# Patient Record
Sex: Male | Born: 1970 | Race: White | Hispanic: No | Marital: Married | Smoking: Never smoker
Health system: Southern US, Community
[De-identification: ages and names within clinical notes are randomized; demographics above are authoritative.]

## PROBLEM LIST (undated history)

## (undated) DIAGNOSIS — K219 Gastro-esophageal reflux disease without esophagitis: Secondary | ICD-10-CM

## (undated) DIAGNOSIS — K589 Irritable bowel syndrome without diarrhea: Secondary | ICD-10-CM

## (undated) HISTORY — PX: KNEE SURGERY: SHX244

---

## 2011-09-29 ENCOUNTER — Emergency Department (HOSPITAL_BASED_OUTPATIENT_CLINIC_OR_DEPARTMENT_OTHER)
Admission: EM | Admit: 2011-09-29 | Discharge: 2011-09-29 | Disposition: A | Discharge status: Home / Self Care | Payer: Self-pay

## 2013-01-25 ENCOUNTER — Emergency Department (HOSPITAL_BASED_OUTPATIENT_CLINIC_OR_DEPARTMENT_OTHER)
Admission: EM | Admit: 2013-01-25 | Discharge: 2013-01-25 | Disposition: A | Discharge status: Home / Self Care | Attending: Emergency Medicine | Admitting: Emergency Medicine

## 2013-01-25 ENCOUNTER — Encounter (HOSPITAL_BASED_OUTPATIENT_CLINIC_OR_DEPARTMENT_OTHER): Payer: Self-pay | Admitting: *Deleted

## 2013-01-25 DIAGNOSIS — T7840XA Allergy, unspecified, initial encounter: Secondary | ICD-10-CM

## 2013-01-25 DIAGNOSIS — R42 Dizziness and giddiness: Secondary | ICD-10-CM | POA: Insufficient documentation

## 2013-01-25 DIAGNOSIS — Y92009 Unspecified place in unspecified non-institutional (private) residence as the place of occurrence of the external cause: Secondary | ICD-10-CM | POA: Insufficient documentation

## 2013-01-25 DIAGNOSIS — T6391XA Toxic effect of contact with unspecified venomous animal, accidental (unintentional), initial encounter: Secondary | ICD-10-CM | POA: Insufficient documentation

## 2013-01-25 DIAGNOSIS — R51 Headache: Secondary | ICD-10-CM | POA: Insufficient documentation

## 2013-01-25 DIAGNOSIS — Y939 Activity, unspecified: Secondary | ICD-10-CM | POA: Insufficient documentation

## 2013-01-25 DIAGNOSIS — IMO0001 Reserved for inherently not codable concepts without codable children: Secondary | ICD-10-CM | POA: Insufficient documentation

## 2013-01-25 MED ORDER — FAMOTIDINE IN NACL 20-0.9 MG/50ML-% IV SOLN
20.0000 mg | Freq: Once | INTRAVENOUS | Status: AC
  Administered 2013-01-25: 20 mg via INTRAVENOUS
  Filled 2013-01-25: qty 50

## 2013-01-25 MED ORDER — DIPHENHYDRAMINE HCL 25 MG PO TABS: 25.0000 mg | ORAL_TABLET | Freq: Four times a day (QID) | ORAL | Status: DC

## 2013-01-25 MED ORDER — METHYLPREDNISOLONE SODIUM SUCC 125 MG IJ SOLR
125.0000 mg | Freq: Once | INTRAMUSCULAR | Status: AC
  Administered 2013-01-25: 125 mg via INTRAVENOUS
  Filled 2013-01-25: qty 2

## 2013-01-25 MED ORDER — PREDNISONE 50 MG PO TABS: 50.0000 mg | ORAL_TABLET | Freq: Every day | ORAL | Status: DC

## 2013-01-25 MED ORDER — DIPHENHYDRAMINE HCL 50 MG/ML IJ SOLN
25.0000 mg | Freq: Once | INTRAMUSCULAR | Status: AC
  Administered 2013-01-25: 25 mg via INTRAVENOUS
  Filled 2013-01-25: qty 1

## 2013-01-25 MED ORDER — SODIUM CHLORIDE 0.9 % IV BOLUS (SEPSIS)
1000.0000 mL | Freq: Once | INTRAVENOUS | Status: AC
  Administered 2013-01-25: 1000 mL via INTRAVENOUS
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
## 2013-01-25 NOTE — ED Notes (Signed)
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 Last time hospitalized x 2 days

## 2013-01-25 NOTE — ED Notes (Signed)
D/c home with ride- rx x 2 given for benadryl and prednisone- note for work given

## 2014-07-17 ENCOUNTER — Emergency Department (HOSPITAL_BASED_OUTPATIENT_CLINIC_OR_DEPARTMENT_OTHER)
Admission: EM | Admit: 2014-07-17 | Discharge: 2014-07-17 | Disposition: A | Discharge status: Home / Self Care | Payer: 59 | Attending: Emergency Medicine | Admitting: Emergency Medicine

## 2014-07-17 ENCOUNTER — Emergency Department (HOSPITAL_BASED_OUTPATIENT_CLINIC_OR_DEPARTMENT_OTHER): Payer: 59

## 2014-07-17 ENCOUNTER — Encounter (HOSPITAL_BASED_OUTPATIENT_CLINIC_OR_DEPARTMENT_OTHER): Payer: Self-pay | Admitting: Emergency Medicine

## 2014-07-17 DIAGNOSIS — Z8719 Personal history of other diseases of the digestive system: Secondary | ICD-10-CM | POA: Diagnosis not present

## 2014-07-17 DIAGNOSIS — S8991XA Unspecified injury of right lower leg, initial encounter: Secondary | ICD-10-CM | POA: Diagnosis present

## 2014-07-17 DIAGNOSIS — Z7952 Long term (current) use of systemic steroids: Secondary | ICD-10-CM | POA: Insufficient documentation

## 2014-07-17 DIAGNOSIS — S86911A Strain of unspecified muscle(s) and tendon(s) at lower leg level, right leg, initial encounter: Secondary | ICD-10-CM | POA: Diagnosis not present

## 2014-07-17 DIAGNOSIS — X58XXXA Exposure to other specified factors, initial encounter: Secondary | ICD-10-CM | POA: Diagnosis not present

## 2014-07-17 DIAGNOSIS — Y9289 Other specified places as the place of occurrence of the external cause: Secondary | ICD-10-CM | POA: Diagnosis not present

## 2014-07-17 DIAGNOSIS — Z79899 Other long term (current) drug therapy: Secondary | ICD-10-CM | POA: Insufficient documentation

## 2014-07-17 DIAGNOSIS — S838X1A Sprain of other specified parts of right knee, initial encounter: Secondary | ICD-10-CM | POA: Diagnosis not present

## 2014-07-17 DIAGNOSIS — Y9389 Activity, other specified: Secondary | ICD-10-CM | POA: Insufficient documentation

## 2014-07-17 DIAGNOSIS — S8391XA Sprain of unspecified site of right knee, initial encounter: Secondary | ICD-10-CM

## 2014-07-17 HISTORY — DX: Gastro-esophageal reflux disease without esophagitis: K21.9

## 2014-07-17 MED ORDER — IBUPROFEN 800 MG PO TABS
800.0000 mg | ORAL_TABLET | Freq: Once | ORAL | Status: AC
  Administered 2014-07-17: 800 mg via ORAL
  Filled 2014-07-17: qty 1

## 2014-07-17 MED ORDER — IBUPROFEN 800 MG PO TABS: 800.0000 mg | ORAL_TABLET | Freq: Three times a day (TID) | ORAL | Status: DC

## 2014-07-17 MED ORDER — HYDROCODONE-ACETAMINOPHEN 5-325 MG PO TABS: 1.0000 | ORAL_TABLET | ORAL | Status: DC | PRN

## 2014-07-17 NOTE — ED Notes (Signed)
Pt reports he was reaching to open a door and felt a "pulling sensation" to his right knee, pt denies any trauma to the area. No contusion, swelling or deformity noted on assessment. CMS intact. Pt also c/o rt shoulder pain to which pt has a hx of previous injury to rt shoulder.

## 2014-07-17 NOTE — Discharge Instructions (Signed)
Cryotherapy °Cryotherapy is when you put ice on your injury. Ice helps lessen pain and puffiness (swelling) after an injury. Ice works the best when you start using it in the first 24 to 48 hours after an injury. °HOME CARE °· Put a dry or damp towel between the ice pack and your skin. °· You may press gently on the ice pack. °· Leave the ice on for no more than 10 to 20 minutes at a time. °· Check your skin after 5 minutes to make sure your skin is okay. °· Rest at least 20 minutes between ice pack uses. °· Stop using ice when your skin loses feeling (numbness). °· Do not use ice on someone who cannot tell you when it hurts. This includes small children and people with memory problems (dementia). °GET HELP RIGHT AWAY IF: °· You have white spots on your skin. °· Your skin turns blue or pale. °· Your skin feels waxy or hard. °· Your puffiness gets worse. °MAKE SURE YOU:  °· Understand these instructions. °· Will watch your condition. °· Will get help right away if you are not doing well or get worse. °Document Released: 03/19/2008 Document Revised: 12/24/2011 Document Reviewed: 05/24/2011 °[employer]® Patient Information ©2015 [employer]. This information is not intended to replace advice given to you by your health care provider. Make sure you discuss any questions you have with your health care provider. ° °Joint Sprain °A sprain is a tear or stretch in the ligaments that hold a joint together. Severe sprains may need as long as 3-6 weeks of immobilization and/or exercises to heal completely. Sprained joints should be rested and protected. If not, they can become unstable and prone to re-injury. Proper treatment can reduce your pain, shorten the period of disability, and reduce the risk of repeated injuries. °TREATMENT  °· Rest and elevate the injured joint to reduce pain and swelling. °· Apply ice packs to the injury for 20-30 minutes every 2-3 hours for the next 2-3 days. °· Keep the injury wrapped in a  compression bandage or splint as long as the joint is painful or as instructed by your caregiver. °· Do not use the injured joint until it is completely healed to prevent re-injury and chronic instability. Follow the instructions of your caregiver. °· Long-term sprain management may require exercises and/or treatment by a physical therapist. Taping or special braces may help stabilize the joint until it is completely better. °SEEK MEDICAL CARE IF:  °· You develop increased pain or swelling of the joint. °· You develop increasing redness and warmth of the joint. °· You develop a fever. °· It becomes stiff. °· Your hand or foot gets cold or numb. °Document Released: 11/08/2004 Document Revised: 12/24/2011 Document Reviewed: 10/18/2008 °Exit[employer]® Patient Information ©2015 [employer]. This information is not intended to replace advice given to you by your health care provider. Make sure you discuss any questions you have with your health care provider. ° °

## 2014-07-17 NOTE — ED Notes (Signed)
Pt reports twisting right knee today - reports pain, decreased ROM.

## 2014-07-17 NOTE — ED Notes (Signed)
Pt ambulating independently w/ steady gait on d/c in no acute distress, A&Ox4. D/c instructions reviewed w/ pt and family - pt and family deny any further questions or concerns at present. Rx given x2  

## 2014-07-18 NOTE — ED Provider Notes (Signed)
Medical screening examination/treatment/procedure(s) were performed by non-physician practitioner and as supervising physician I was immediately available for consultation/collaboration.   EKG Interpretation None        Elwin MochaBlair Adlai Sinning, MD 07/18/14 1550

## 2014-07-18 NOTE — ED Provider Notes (Signed)
CSN: [id number]     Arrival date & time 07/17/14  1910 History   First MD Initiated Contact with Patient 07/17/14 1953     Chief Complaint  Patient presents with  . Knee Pain     (Consider location/radiation/quality/duration/timing/severity/associated sxs/prior Treatment) Patient is a 43 y.o. male presenting with knee pain. The history is provided by the patient and the spouse. No language interpreter was used.  Knee Pain Location:  Knee Injury: yes   Knee location:  R knee Chronicity:  New Dislocation: no   Foreign body present:  No foreign bodies Associated symptoms: no fever   Associated symptoms comment:  Twisting injury to right knee earlier today with persistent pain and swelling. He is able to bear limited weight. No other injury. He reports having pain in right knee joint in the past without specific work up or evaluation.   Past Medical History  Diagnosis Date  . GERD (gastroesophageal reflux disease)    Past Surgical History  Procedure Laterality Date  . Knee surgery     History reviewed. No pertinent family history. History  Substance Use Topics  . Smoking status: Never Smoker   . Smokeless tobacco: Not on file  . Alcohol Use: No    Review of Systems  Constitutional: Negative for fever and chills.  Respiratory: Negative.   Cardiovascular: Negative.   Gastrointestinal: Negative.   Musculoskeletal:       See HPI  Skin: Negative.   Neurological: Negative.       Allergies  Beeswax  Home Medications   Prior to Admission medications   Medication Sig Start Date End Date Taking? Authorizing Provider  diphenhydrAMINE (BENADRYL) 25 MG tablet Take 1 tablet (25 mg total) by mouth every 6 (six) hours. 01/25/13   Ethelda Chick, MD  HYDROcodone-acetaminophen (NORCO/VICODIN) 5-325 MG per tablet Take 1-2 tablets by mouth every 4 (four) hours as needed. 07/17/14   Shavone Nevers A Amelia Burgard, PA-C  ibuprofen (ADVIL,MOTRIN) 800 MG tablet Take 1 tablet (800 mg total) by mouth 3  (three) times daily. 07/17/14   Dellanira Dillow A [place]anyika Bowlds, PA-C  predniSONE (DELTASONE) 50 MG tablet Take 1 tablet (50 mg total) by mouth daily. 01/25/13   Ethelda Chick, MD   BP 122/87  Pulse 66  Temp(Src) 98.2 F (36.8 C) (Oral)  Resp 16  Ht 5\' 10"  (1.778 m)  Wt 164 lb (74.39 kg)  BMI 23.53 kg/m2  SpO2 100% Physical Exam  Constitutional: He is oriented to person, place, and time. He appears well-developed and well-nourished.  HENT:  Head: Normocephalic.  Neck: Normal range of motion.  Pulmonary/Chest: Effort normal.  Musculoskeletal: Normal range of motion.  Right knee tender anteriorly without palpable effusion. Joint stable without laxity. No calf or thigh tenderness. No pain with meniscal maneuvers.   Neurological: He is alert and oriented to person, place, and time.  Skin: Skin is warm and dry.  Psychiatric: He has a normal mood and affect.    ED Course  Procedures (including critical care time) Labs Review Labs Reviewed - No data to display  Imaging Review Dg Knee Complete 4 Views Right  07/17/2014   CLINICAL DATA:  Twisted right knee today with pain in decreased range of motion  EXAM: RIGHT KNEE - COMPLETE 4+ VIEW  COMPARISON:  None.  FINDINGS: No fracture or dislocation. No significant degenerative change. No joint effusion.  IMPRESSION: No significant abnormalities   Electronically Signed   By: Esperanza Heir M.D.   On: 07/17/2014 20:26  EKG Interpretation None      MDM   Final diagnoses:  Knee sprain and strain, right, initial encounter    Knee immobilizer provided along with ortho follow up for further evaluation if pain is persistent.    Arnoldo HookerShari A Orvel Cutsforth, PA-C 07/18/14 1550

## 2015-06-04 ENCOUNTER — Emergency Department (HOSPITAL_BASED_OUTPATIENT_CLINIC_OR_DEPARTMENT_OTHER)
Admission: EM | Admit: 2015-06-04 | Discharge: 2015-06-04 | Disposition: A | Discharge status: Home / Self Care | Payer: 59 | Attending: Emergency Medicine | Admitting: Emergency Medicine

## 2015-06-04 ENCOUNTER — Encounter (HOSPITAL_BASED_OUTPATIENT_CLINIC_OR_DEPARTMENT_OTHER): Payer: Self-pay | Admitting: Emergency Medicine

## 2015-06-04 DIAGNOSIS — R531 Weakness: Secondary | ICD-10-CM | POA: Diagnosis not present

## 2015-06-04 DIAGNOSIS — Z8719 Personal history of other diseases of the digestive system: Secondary | ICD-10-CM | POA: Diagnosis not present

## 2015-06-04 DIAGNOSIS — R51 Headache: Secondary | ICD-10-CM | POA: Diagnosis not present

## 2015-06-04 DIAGNOSIS — Z7952 Long term (current) use of systemic steroids: Secondary | ICD-10-CM | POA: Insufficient documentation

## 2015-06-04 DIAGNOSIS — Z791 Long term (current) use of non-steroidal anti-inflammatories (NSAID): Secondary | ICD-10-CM | POA: Insufficient documentation

## 2015-06-04 DIAGNOSIS — A084 Viral intestinal infection, unspecified: Secondary | ICD-10-CM | POA: Insufficient documentation

## 2015-06-04 DIAGNOSIS — R42 Dizziness and giddiness: Secondary | ICD-10-CM | POA: Diagnosis present

## 2015-06-04 LAB — BASIC METABOLIC PANEL
Anion gap: 7 (ref 5–15)
BUN: 16 mg/dL (ref 6–20)
CO2: 26 mmol/L (ref 22–32)
CREATININE: 0.8 mg/dL (ref 0.61–1.24)
Calcium: 9.3 mg/dL (ref 8.9–10.3)
Chloride: 109 mmol/L (ref 101–111)
Glucose, Bld: 137 mg/dL — ABNORMAL HIGH (ref 65–99)
Potassium: 3.6 mmol/L (ref 3.5–5.1)
SODIUM: 142 mmol/L (ref 135–145)

## 2015-06-04 LAB — CBC
HCT: 44.9 % (ref 39.0–52.0)
Hemoglobin: 15.3 g/dL (ref 13.0–17.0)
MCH: 31.9 pg (ref 26.0–34.0)
MCHC: 34.1 g/dL (ref 30.0–36.0)
MCV: 93.7 fL (ref 78.0–100.0)
PLATELETS: 222 10*3/uL (ref 150–400)
RBC: 4.79 MIL/uL (ref 4.22–5.81)
RDW: 12.3 % (ref 11.5–15.5)
WBC: 7.4 10*3/uL (ref 4.0–10.5)

## 2015-06-04 LAB — URINALYSIS, ROUTINE W REFLEX MICROSCOPIC
BILIRUBIN URINE: NEGATIVE
Glucose, UA: NEGATIVE mg/dL
Hgb urine dipstick: NEGATIVE
KETONES UR: 15 mg/dL — AB
Leukocytes, UA: NEGATIVE
NITRITE: NEGATIVE
PROTEIN: NEGATIVE mg/dL
Specific Gravity, Urine: 1.028 (ref 1.005–1.030)
UROBILINOGEN UA: 0.2 mg/dL (ref 0.0–1.0)
pH: 6.5 (ref 5.0–8.0)

## 2015-06-04 LAB — CBG MONITORING, ED: GLUCOSE-CAPILLARY: 118 mg/dL — AB (ref 65–99)

## 2015-06-04 MED ORDER — ONDANSETRON 8 MG PO TBDP: ORAL_TABLET | ORAL | Status: DC

## 2015-06-04 MED ORDER — ONDANSETRON HCL 4 MG/2ML IJ SOLN
4.0000 mg | Freq: Once | INTRAMUSCULAR | Status: AC
  Administered 2015-06-04: 4 mg via INTRAVENOUS
  Filled 2015-06-04: qty 2

## 2015-06-04 MED ORDER — SODIUM CHLORIDE 0.9 % IV BOLUS (SEPSIS)
1000.0000 mL | Freq: Once | INTRAVENOUS | Status: AC
  Administered 2015-06-04: 1000 mL via INTRAVENOUS

## 2015-06-04 MED ORDER — KETOROLAC TROMETHAMINE 30 MG/ML IJ SOLN
30.0000 mg | Freq: Once | INTRAMUSCULAR | Status: AC
  Administered 2015-06-04: 30 mg via INTRAVENOUS
  Filled 2015-06-04: qty 1

## 2015-06-04 NOTE — ED Notes (Signed)
44 yo with intermittent dizziness and nausea for the past couple of days. Reports vomiting today after breakfast. Has been taking benadryl for a weeks time after being bitten by insects. Pt is alert and oriented x4

## 2015-06-04 NOTE — Discharge Instructions (Signed)
Zofran as prescribed as needed for nausea.  Clear liquids as tolerated for the next 2 days.  Return to the emergency department for severe abdominal pain, bloody stool, or other new and concerning symptoms.   Viral Gastroenteritis Viral gastroenteritis is also known as stomach flu. This condition affects the stomach and intestinal tract. It can cause sudden diarrhea and vomiting. The illness typically lasts 3 to 8 days. Most people develop an immune response that eventually gets rid of the virus. While this natural response develops, the virus can make you quite ill. CAUSES  Many different viruses can cause gastroenteritis, such as rotavirus or noroviruses. You can catch one of these viruses by consuming contaminated food or water. You may also catch a virus by sharing utensils or other personal items with an infected person or by touching a contaminated surface. SYMPTOMS  The most common symptoms are diarrhea and vomiting. These problems can cause a severe loss of body fluids (dehydration) and a body salt (electrolyte) imbalance. Other symptoms may include:  Fever.  Headache.  Fatigue.  Abdominal pain. DIAGNOSIS  Your caregiver can usually diagnose viral gastroenteritis based on your symptoms and a physical exam. A stool sample may also be taken to test for the presence of viruses or other infections. TREATMENT  This illness typically goes away on its own. Treatments are aimed at rehydration. The most serious cases of viral gastroenteritis involve vomiting so severely that you are not able to keep fluids down. In these cases, fluids must be given through an intravenous line (IV). HOME CARE INSTRUCTIONS   Drink enough fluids to keep your urine clear or pale yellow. Drink small amounts of fluids frequently and increase the amounts as tolerated.  Ask your caregiver for specific rehydration instructions.  Avoid:  Foods high in sugar.  Alcohol.  Carbonated  drinks.  Tobacco.  Juice.  Caffeine drinks.  Extremely hot or cold fluids.  Fatty, greasy foods.  Too much intake of anything at one time.  Dairy products until 24 to 48 hours after diarrhea stops.  You may consume probiotics. Probiotics are active cultures of beneficial bacteria. They may lessen the amount and number of diarrheal stools in adults. Probiotics can be found in yogurt with active cultures and in supplements.  Wash your hands well to avoid spreading the virus.  Only take over-the-counter or prescription medicines for pain, discomfort, or fever as directed by your caregiver. Do not give aspirin to children. Antidiarrheal medicines are not recommended.  Ask your caregiver if you should continue to take your regular prescribed and over-the-counter medicines.  Keep all follow-up appointments as directed by your caregiver. SEEK IMMEDIATE MEDICAL CARE IF:   You are unable to keep fluids down.  You do not urinate at least once every 6 to 8 hours.  You develop shortness of breath.  You notice blood in your stool or vomit. This may look like coffee grounds.  You have abdominal pain that increases or is concentrated in one small area (localized).  You have persistent vomiting or diarrhea.  You have a fever.  The patient is a child younger than 3 months, and he or she has a fever.  The patient is a child older than 3 months, and he or she has a fever and persistent symptoms.  The patient is a child older than 3 months, and he or she has a fever and symptoms suddenly get worse.  The patient is a baby, and he or she has no tears when crying.  MAKE SURE YOU:   Understand these instructions.  Will watch your condition.  Will get help right away if you are not doing well or get worse. Document Released: 10/01/2005 Document Revised: 12/24/2011 Document Reviewed: 07/18/2011 [facility] Patient Information 2015 [place], [place]. This information is not intended to replace  advice given to you by your health care provider. Make sure you discuss any questions you have with your health care provider.

## 2015-06-04 NOTE — ED Provider Notes (Signed)
CSN: [id number]     Arrival date & time 06/04/15  1826 History  This chart was scribed for Geoffery Lyons, MD by Budd Palmer, ED Scribe. This patient was seen in room MH10/MH10 and the patient's care was started at 9:10 PM.    Chief Complaint  Patient presents with  . Dizziness  . Nausea   Patient is a 44 y.o. male presenting with dizziness. The history is provided by the patient. No language interpreter was used.  Dizziness Quality:  Head spinning Severity:  Moderate Onset quality:  Gradual Duration:  3 days Timing:  Intermittent Chronicity:  New Relieved by:  None tried Worsened by:  Nothing Ineffective treatments:  None tried Associated symptoms: headaches, vomiting and weakness   Associated symptoms: no diarrhea   Headaches:    Severity:  Moderate   Onset quality:  Gradual   Timing:  Intermittent   Chronicity:  New Vomiting:    Number of occurrences:  1   Progression:  Resolved  HPI Comments: Edgar Gutierrez is a 44 y.o. male who presents to the Emergency Department complaining of intermittent dizziness and nausea onset 3 days ago. He notes associated HA, vomiting (1x this morning), and weakness. He reports he sustained several spider bites all over his legs and torso on 8/12 while camping. He states the bites were large, red, and had a pimple-like appearance. He has taken benadryl 8/12 to 8/17 for this with moderate relief, and noticeable reduction in size of the bites. He denies any sick contacts. Pt denies diarrhea, abdominal pain, and fever.  Past Medical History  Diagnosis Date  . GERD (gastroesophageal reflux disease)    Past Surgical History  Procedure Laterality Date  . Knee surgery     No family history on file. Social History  Substance Use Topics  . Smoking status: Never Smoker   . Smokeless tobacco: None  . Alcohol Use: No    Review of Systems  Constitutional: Negative for fever.  Gastrointestinal: Positive for vomiting. Negative for abdominal pain and  diarrhea.  Neurological: Positive for dizziness, weakness and headaches.  All other systems reviewed and are negative.   Allergies  Bee venom  Home Medications   Prior to Admission medications   Medication Sig Start Date End Date Taking? Authorizing Provider  diphenhydrAMINE (BENADRYL) 25 MG tablet Take 1 tablet (25 mg total) by mouth every 6 (six) hours. 01/25/13  Yes Jerelyn Scott, MD  HYDROcodone-acetaminophen (NORCO/VICODIN) 5-325 MG per tablet Take 1-2 tablets by mouth every 4 (four) hours as needed. 07/17/14   Elpidio Anis, PA-C  ibuprofen (ADVIL,MOTRIN) 800 MG tablet Take 1 tablet (800 mg total) by mouth 3 (three) times daily. 07/17/14   Elpidio Anis, PA-C  predniSONE (DELTASONE) 50 MG tablet Take 1 tablet (50 mg total) by mouth daily. 01/25/13   Jerelyn Scott, MD   BP 122/85 mmHg  Pulse 69  Temp(Src) 98.2 F (36.8 C) (Oral)  Resp 18  Ht  (1.778 m)  Wt 168 lb (76.204 kg)  BMI 24.11 kg/m2  SpO2 99% Physical Exam  Constitutional: He appears well-developed and well-nourished. No distress.  HENT:  Head: Normocephalic and atraumatic.  Mouth/Throat: Oropharynx is clear and moist. No oropharyngeal exudate.  Eyes: Conjunctivae and EOM are normal. Pupils are equal, round, and reactive to light. Right eye exhibits no discharge. Left eye exhibits no discharge. No scleral icterus.  Neck: Normal range of motion. Neck supple. No JVD present. No thyromegaly present.  Cardiovascular: Normal rate, regular rhythm, normal heart sounds  and intact distal pulses.  Exam reveals no gallop and no friction rub.   No murmur heard. Pulmonary/Chest: Effort normal and breath sounds normal. No respiratory distress. He has no wheezes. He has no rales.  Abdominal: Soft. Bowel sounds are normal. He exhibits no distension and no mass. There is no tenderness.  Musculoskeletal: Normal range of motion. He exhibits no edema or tenderness.  Lymphadenopathy:    He has no cervical adenopathy.  Neurological:  He is alert. Coordination normal.  Skin: Skin is warm and dry. No rash noted. No erythema.  Psychiatric: He has a normal mood and affect. His behavior is normal.  Nursing note and vitals reviewed.   ED Course  Procedures  DIAGNOSTIC STUDIES: Oxygen Saturation is 99% on RA, normal by my interpretation.    COORDINATION OF CARE: 9:17 PM - Discussed normal diagnostic studies. Discussed plans to order IV fluids. Pt advised of plan for treatment and pt agrees.  Labs Review Labs Reviewed  BASIC METABOLIC PANEL - Abnormal; Notable for the following:    Glucose, Bld 137 (*)    All other components within normal limits  URINALYSIS, ROUTINE W REFLEX MICROSCOPIC (NOT AT [facility]) - Abnormal; Notable for the following:    APPearance CLOUDY (*)    Ketones, ur 15 (*)    All other components within normal limits  CBG MONITORING, ED - Abnormal; Notable for the following:    Glucose-Capillary 118 (*)    All other components within normal limits  CBC    Imaging Review No results found. I have personally reviewed and evaluated these images and lab results as part of my medical decision-making.   EKG Interpretation None      MDM   Final diagnoses:  None    Patient's presentation, exam, and workup consistent with a viral gastroenteritis. He is feeling better after fluids and medications in the ER. His abdomen is benign and physical examination is otherwise unremarkable. I do not feel as though further workup is indicated and believe he is appropriate for discharge. He does understand to return if symptoms significantly worsen or change.  I personally performed the services described in this documentation, which was scribed in my presence. The recorded information has been reviewed and is accurate.      Geoffery Lyons, MD 06/06/15 2308

## 2018-02-06 ENCOUNTER — Encounter (HOSPITAL_BASED_OUTPATIENT_CLINIC_OR_DEPARTMENT_OTHER): Payer: Self-pay

## 2018-02-06 ENCOUNTER — Emergency Department (HOSPITAL_BASED_OUTPATIENT_CLINIC_OR_DEPARTMENT_OTHER): Payer: 59

## 2018-02-06 ENCOUNTER — Other Ambulatory Visit: Payer: Self-pay

## 2018-02-06 ENCOUNTER — Emergency Department (HOSPITAL_BASED_OUTPATIENT_CLINIC_OR_DEPARTMENT_OTHER)
Admission: EM | Admit: 2018-02-06 | Discharge: 2018-02-06 | Disposition: A | Discharge status: Home / Self Care | Payer: 59 | Attending: Emergency Medicine | Admitting: Emergency Medicine

## 2018-02-06 DIAGNOSIS — M791 Myalgia, unspecified site: Secondary | ICD-10-CM | POA: Insufficient documentation

## 2018-02-06 DIAGNOSIS — R070 Pain in throat: Secondary | ICD-10-CM | POA: Diagnosis not present

## 2018-02-06 DIAGNOSIS — R05 Cough: Secondary | ICD-10-CM

## 2018-02-06 DIAGNOSIS — R059 Cough, unspecified: Secondary | ICD-10-CM

## 2018-02-06 DIAGNOSIS — Z79899 Other long term (current) drug therapy: Secondary | ICD-10-CM | POA: Insufficient documentation

## 2018-02-06 MED ORDER — FLUTICASONE PROPIONATE 50 MCG/ACT NA SUSP: 1.0000 | Freq: Every day | NASAL | 0 refills | Status: DC

## 2018-02-06 MED ORDER — CETIRIZINE HCL 10 MG PO TABS: 10.0000 mg | ORAL_TABLET | Freq: Every day | ORAL | 1 refills | Status: DC

## 2018-02-06 MED ORDER — BENZONATATE 100 MG PO CAPS: 100.0000 mg | ORAL_CAPSULE | Freq: Three times a day (TID) | ORAL | 0 refills | Status: DC

## 2018-02-06 NOTE — ED Triage Notes (Signed)
C/o flu like sx x 1 week-NAD-steady gait 

## 2018-02-06 NOTE — Discharge Instructions (Signed)
As discussed, your chest x-ray did not show any acute cardiopulmonary abnormalities.  Make sure that you stay well-hydrated get some rest and use antihistamines daily with nasal spray to help with any postnasal drip irritation.  Tessalon Perles as needed for cough.  Viral upper respiratory infection may take up to 15 days to clear up.  Follow-up with your primary care provider.  Return if symptoms worsen or new concerning symptoms in the meantime.

## 2018-02-07 NOTE — ED Provider Notes (Signed)
[facility] EMERGENCY DEPARTMENT Provider Note   CSN: [id number] Arrival date & time: 02/06/18  1951     History   Chief Complaint Chief Complaint  Patient presents with  . Cough    HPI Edgar Gutierrez is a 47 y.o. male with past medical history significant for GERD presenting with 1 week of productive cough with known ill contact with a-month-old in daycare.  Edgar Gutierrez reports that this started with a sore throat a week ago and progressed to a cough which is nonproductive.  Edgar Gutierrez denies any fever, chills, nausea, vomiting, diarrhea, abdominal pain, urinary symptoms.  Edgar Gutierrez does endorses history of cleaning very dusty areas of his mother's home and has been trying to use antihistamines sporadically to help with his symptoms without success.  Edgar Gutierrez states that Edgar Gutierrez took cetirizine twice over the last week.  Edgar Gutierrez has not tried anything else for his symptoms.  No chest pain or shortness of breath.  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  HPI  Past Medical History:  Diagnosis Date  . GERD (gastroesophageal reflux disease)     There are no active problems to display for this patient.   Past Surgical History:  Procedure Laterality Date  . KNEE SURGERY          Home Medications    Prior to Admission medications   Medication Sig Start Date End Date Taking? Authorizing Provider  omeprazole (PRILOSEC) 40 MG capsule Take 40 mg by mouth daily.   Yes [provider]  sildenafil (VIAGRA) 25 MG tablet Take 25 mg by mouth as needed for erectile dysfunction.   Yes [provider]  benzonatate (TESSALON) 100 MG capsule Take 1 capsule (100 mg total) by mouth every 8 (eight) hours. 02/06/18   Georgiana ShoreMitchell, Jessica B, PA-C  cetirizine (ZYRTEC) 10 MG tablet Take 1 tablet (10 mg total) by mouth daily. 02/06/18   Georgiana ShoreMitchell, Jessica B, PA-C  diphenhydrAMINE (BENADRYL) 25 MG tablet Take 1 tablet (25 mg total) by mouth every 6 (six) hours. 01/25/13    Mabe, Latanya MaudlinMartha L, MD  fluticasone (FLONASE) 50 MCG/ACT nasal spray Place 1 spray into both nostrils daily. 02/06/18   Georgiana ShoreMitchell, Jessica B, PA-C  ibuprofen (ADVIL,MOTRIN) 800 MG tablet Take 1 tablet (800 mg total) by mouth 3 (three) times daily. 07/17/14   Elpidio AnisUpstill, Shari, PA-C    Family History No family history on file.  Social History Social History   Tobacco Use  . Smoking status: Never Smoker  . Smokeless tobacco: Never Used  Substance Use Topics  . Alcohol use: Yes    Comment: occ  . Drug use: No     Allergies   Bee venom   Review of Systems Review of Systems  Constitutional: Negative for chills and fever.  HENT: Positive for congestion and postnasal drip. Negative for ear pain, sore throat and trouble swallowing.   Respiratory: Positive for cough. Negative for choking, chest tightness, shortness of breath, wheezing and stridor.   Cardiovascular: Negative for chest pain, palpitations and leg swelling.  Gastrointestinal: Negative for abdominal distention, abdominal pain, blood in stool, diarrhea, nausea and vomiting.  Genitourinary: Negative for difficulty urinating, dysuria, frequency and hematuria.  Musculoskeletal: Positive for myalgias. Negative for arthralgias, back pain, gait problem, joint swelling, neck pain and neck stiffness.  Skin: Negative for color change, pallor and rash.  Neurological: Negative for dizziness, seizures, syncope, weakness, light-headedness, numbness and headaches.     Physical Exam Updated Vital Signs BP Marland Kitchen(!)  141/88 (BP Location: Left Arm)   Pulse 97   Temp 99.2 F (37.3 C) (Oral)   Resp 18   Ht 5\' 10"  (1.778 m)   Wt 88.7 kg (195 lb 8.8 oz)   SpO2 98%   BMI 28.06 kg/m   Physical Exam  Constitutional: Edgar Gutierrez appears well-developed and well-nourished. No distress.  Afebrile, nontoxic-appearing, sitting comfortably in bed no acute distress.  HENT:  Head: Normocephalic and atraumatic.  Mouth/Throat: Oropharynx is clear and moist. No  oropharyngeal exudate.  Eyes: Conjunctivae are normal. Right eye exhibits no discharge. Left eye exhibits no discharge.  Neck: Normal range of motion. Neck supple.  Cardiovascular: Normal rate, regular rhythm and normal heart sounds.  No murmur heard. Pulmonary/Chest: Effort normal and breath sounds normal. No stridor. No respiratory distress. Edgar Gutierrez has no wheezes. Edgar Gutierrez has no rales.  Lungs cta bilaterally, mild wheezing in right lower field cleared after cough  Abdominal: Edgar Gutierrez exhibits no distension.  Musculoskeletal: Normal range of motion.  Lymphadenopathy:    Edgar Gutierrez has no cervical adenopathy.  Neurological: Edgar Gutierrez is alert.  Skin: Skin is warm and dry. No rash noted. Edgar Gutierrez is not diaphoretic. No erythema. No pallor.  Psychiatric: Edgar Gutierrez has a normal mood and affect.  Nursing note and vitals reviewed.    ED Treatments / Results  Labs (all labs ordered are listed, but only abnormal results are displayed) Labs Reviewed - No data to display  EKG None  Radiology Dg Chest 2 View  Result Date: 02/06/2018 CLINICAL DATA:  Productive cough EXAM: CHEST - 2 VIEW COMPARISON:  None. FINDINGS: The heart size and mediastinal contours are within normal limits. Both lungs are clear. Mild degenerative changes of the spine. IMPRESSION: No active cardiopulmonary disease. Electronically Signed   By: Jasmine Pang M.D.   On: 02/06/2018 20:47    Procedures Procedures (including critical care time)  Medications Ordered in ED Medications - No data to display   Initial Impression / Assessment and Plan / ED Course  I have reviewed the triage vital signs and the nursing notes.  Pertinent labs & imaging results that were available during my care of the patient were reviewed by me and considered in my medical decision making (see chart for details).    Patient presenting with progressive onset URI symptoms over the last week likely exacerbated by environmental pollutant and post-nasal drip. Known ill contacts with  daughter in daycare with cough. Likely viral etiology.  Pt CXR negative for acute infiltrate.  Discussed that antibiotics are not indicated for viral infections. Pt will be discharged with symptomatic treatment.    Verbalizes understanding and is agreeable with plan. Pt is hemodynamically stable & in NAD prior to dc.  Final Clinical Impressions(s) / ED Diagnoses   Final diagnoses:  Cough    ED Discharge Orders        Ordered    fluticasone (FLONASE) 50 MCG/ACT nasal spray  Daily     02/06/18 2134    cetirizine (ZYRTEC) 10 MG tablet  Daily     02/06/18 2134    benzonatate (TESSALON) 100 MG capsule  Every 8 hours     02/06/18 2134       Georgiana Shore, PA-C 02/07/18 0234    Melene Plan, DO 02/11/18 1203

## 2018-10-19 ENCOUNTER — Emergency Department (HOSPITAL_BASED_OUTPATIENT_CLINIC_OR_DEPARTMENT_OTHER): Payer: 59

## 2018-10-19 ENCOUNTER — Emergency Department (HOSPITAL_BASED_OUTPATIENT_CLINIC_OR_DEPARTMENT_OTHER)
Admission: EM | Admit: 2018-10-19 | Discharge: 2018-10-19 | Disposition: A | Discharge status: Home / Self Care | Payer: 59 | Attending: Emergency Medicine | Admitting: Emergency Medicine

## 2018-10-19 ENCOUNTER — Other Ambulatory Visit: Payer: Self-pay

## 2018-10-19 ENCOUNTER — Encounter (HOSPITAL_BASED_OUTPATIENT_CLINIC_OR_DEPARTMENT_OTHER): Payer: Self-pay | Admitting: *Deleted

## 2018-10-19 DIAGNOSIS — R69 Illness, unspecified: Secondary | ICD-10-CM

## 2018-10-19 DIAGNOSIS — R52 Pain, unspecified: Secondary | ICD-10-CM | POA: Diagnosis present

## 2018-10-19 DIAGNOSIS — Z79899 Other long term (current) drug therapy: Secondary | ICD-10-CM | POA: Diagnosis not present

## 2018-10-19 DIAGNOSIS — J111 Influenza due to unidentified influenza virus with other respiratory manifestations: Secondary | ICD-10-CM | POA: Diagnosis not present

## 2018-10-19 HISTORY — DX: Irritable bowel syndrome, unspecified: K58.9

## 2018-10-19 LAB — GROUP A STREP BY PCR: Group A Strep by PCR: NOT DETECTED

## 2018-10-19 MED ORDER — GUAIFENESIN ER 600 MG PO TB12: 600.0000 mg | ORAL_TABLET | Freq: Two times a day (BID) | ORAL | 0 refills | Status: DC

## 2018-10-19 MED ORDER — BENZONATATE 100 MG PO CAPS: 100.0000 mg | ORAL_CAPSULE | Freq: Three times a day (TID) | ORAL | 0 refills | Status: AC

## 2018-10-19 MED ORDER — OSELTAMIVIR PHOSPHATE 75 MG PO CAPS: 75.0000 mg | ORAL_CAPSULE | Freq: Two times a day (BID) | ORAL | 0 refills | Status: AC

## 2018-10-19 MED ORDER — ACETAMINOPHEN 325 MG PO TABS
650.0000 mg | ORAL_TABLET | Freq: Once | ORAL | Status: AC | PRN
  Administered 2018-10-19: 650 mg via ORAL
  Filled 2018-10-19: qty 2

## 2018-10-19 NOTE — ED Provider Notes (Signed)
[facility] EMERGENCY DEPARTMENT Provider Note   CSN: [id number] Arrival date & time: 10/19/18  1859     History   Chief Complaint Chief Complaint  Patient presents with  . Cough    body aches  . Sore Throat    HPI Edgar Gutierrez is a 48 y.o. male.  HPI  Patient is a 48 year old male with a history of GERD, IBS, who presents the emergency department today complaining of fevers, chills, generalized body aches, headache, sore throat, that began this morning.  He does note that he has had some rhinorrhea for the last several weeks.  Denies sinus pressure/pain.  Denies any shortness of breath or chest pain.  He states he was recently around his sister a few days ago who has been diagnosed with the flu.  Past Medical History:  Diagnosis Date  . GERD (gastroesophageal reflux disease)   . IBS (irritable bowel syndrome)     There are no active problems to display for this patient.   Past Surgical History:  Procedure Laterality Date  . KNEE SURGERY          Home Medications    Prior to Admission medications   Medication Sig Start Date End Date Taking? Authorizing Provider  omeprazole (PRILOSEC) 40 MG capsule Take 40 mg by mouth daily.   Yes [provider]  benzonatate (TESSALON) 100 MG capsule Take 1 capsule (100 mg total) by mouth every 8 (eight) hours for 5 days. 10/19/18 10/24/18  Leniya Breit S, PA-C  cetirizine (ZYRTEC) 10 MG tablet Take 1 tablet (10 mg total) by mouth daily. 02/06/18   Georgiana ShoreMitchell, Jessica B, PA-C  diphenhydrAMINE (BENADRYL) 25 MG tablet Take 1 tablet (25 mg total) by mouth every 6 (six) hours. 01/25/13   Mabe, Latanya MaudlinMartha L, MD  fluticasone (FLONASE) 50 MCG/ACT nasal spray Place 1 spray into both nostrils daily. 02/06/18   Georgiana ShoreMitchell, Jessica B, PA-C  guaiFENesin (MUCINEX) 600 MG 12 hr tablet Take 1 tablet (600 mg total) by mouth 2 (two) times daily. 10/19/18   Lincoln Kleiner S, PA-C  ibuprofen (ADVIL,MOTRIN) 800 MG tablet Take 1 tablet (800 mg  total) by mouth 3 (three) times daily. 07/17/14   Elpidio AnisUpstill, Shari, PA-C  oseltamivir (TAMIFLU) 75 MG capsule Take 1 capsule (75 mg total) by mouth every 12 (twelve) hours for 5 days. 10/19/18 10/24/18  Xzayvier Fagin S, PA-C  sildenafil (VIAGRA) 25 MG tablet Take 25 mg by mouth as needed for erectile dysfunction.    [provider]    Family History No family history on file.  Social History Social History   Tobacco Use  . Smoking status: Never Smoker  . Smokeless tobacco: Never Used  Substance Use Topics  . Alcohol use: Yes    Comment: occ  . Drug use: No     Allergies   Bee venom   Review of Systems Review of Systems  Constitutional: Positive for chills and fever.  HENT: Positive for congestion, rhinorrhea and sore throat. Negative for sneezing.   Eyes: Negative for visual disturbance.  Respiratory: Positive for cough. Negative for shortness of breath and wheezing.   Cardiovascular: Negative for chest pain.  Gastrointestinal: Positive for diarrhea. Negative for abdominal pain, constipation, nausea and vomiting.  Genitourinary: Negative for dysuria.  Musculoskeletal: Negative for back pain.  Neurological: Positive for headaches.     Physical Exam Updated Vital Signs BP 108/80 (BP Location: Right Arm)   Pulse 98   Temp 99.4 F (37.4 C) (Oral)  Resp 16   Ht 5\' 10"  (1.778 m)   Wt 88.5 kg   SpO2 98%   BMI 27.98 kg/m   Physical Exam Vitals signs and nursing note reviewed.  Constitutional:      General: He is not in acute distress.    Appearance: He is well-developed. He is not ill-appearing or toxic-appearing.     Comments: Eating soup in bed in no acute distress.  HENT:     Head: Normocephalic and atraumatic.     Ears:     Comments: Right TM obstructed by cerumen, left TM with serous effusion.  Partially obstructed by cerumen.    Nose: Congestion present.     Mouth/Throat:     Mouth: Mucous membranes are moist.     Pharynx: Uvula midline. Posterior  oropharyngeal erythema present. No pharyngeal swelling or oropharyngeal exudate.  Eyes:     Conjunctiva/sclera: Conjunctivae normal.  Neck:     Musculoskeletal: Neck supple.  Cardiovascular:     Rate and Rhythm: Normal rate and regular rhythm.     Heart sounds: Normal heart sounds. No murmur.  Pulmonary:     Effort: Pulmonary effort is normal. No respiratory distress.     Breath sounds: Normal breath sounds. No stridor. No wheezing or rhonchi.  Abdominal:     General: Bowel sounds are normal. There is no distension.     Palpations: Abdomen is soft. There is no mass.     Tenderness: There is no abdominal tenderness.  Skin:    General: Skin is warm and dry.  Neurological:     Mental Status: He is alert.  Psychiatric:        Mood and Affect: Mood normal.     ED Treatments / Results  Labs (all labs ordered are listed, but only abnormal results are displayed) Labs Reviewed  GROUP A STREP BY PCR    EKG None  Radiology Dg Chest 2 View  Result Date: 10/19/2018 CLINICAL DATA:  Low-grade fever with aches. Sinus congestion x2 weeks. EXAM: CHEST - 2 VIEW COMPARISON:  07/24/2018 FINDINGS: The heart size and mediastinal contours are within normal limits. Both lungs are clear. The visualized skeletal structures are unremarkable. IMPRESSION: No active cardiopulmonary disease. Electronically Signed   By: Tollie Eth M.D.   On: 10/19/2018 20:05    Procedures Procedures (including critical care time)  Medications Ordered in ED Medications  acetaminophen (TYLENOL) tablet 650 mg (650 mg Oral Given 10/19/18 1924)     Initial Impression / Assessment and Plan / ED Course  I have reviewed the triage vital signs and the nursing notes.  Pertinent labs & imaging results that were available during my care of the patient were reviewed by me and considered in my medical decision making (see chart for details).     Final Clinical Impressions(s) / ED Diagnoses   Final diagnoses:    Influenza-like illness   Patient presenting with rhinorrhea, congestion, sore throat, cough and fevers starting this morning.  Was recently around his sister who was diagnosed with the flu.  He did have some rhinorrhea and congestion for the last few weeks however have lower suspicion for bacterial sinusitis given his recent exposure to influenza.  Symptoms seem more consistent with this.  He has no tenderness to the maxillary or frontal sinuses.  He also has an associated cough which would be less likely with bacterial sinusitis.  Strep test is negative.  Chest x-ray is negative.  He is in the window for Tamiflu, discussed  giving Rx for this.  Discussed side effect profile and cost.  Advised supportive therapy with Tylenol, Motrin, hydration and cough medication.  Will DC see him with symptomatic therapy.  Have advised him to follow-up with his PCP and return to the ER for new or worsening symptoms.  He voices understanding of the plan and reasons to return to the ED.  All questions answered..  ED Discharge Orders         Ordered    oseltamivir (TAMIFLU) 75 MG capsule  Every 12 hours     10/19/18 2009    benzonatate (TESSALON) 100 MG capsule  Every 8 hours     10/19/18 2009    guaiFENesin (MUCINEX) 600 MG 12 hr tablet  2 times daily     10/19/18 2009           Karrie MeresCouture, Lorra Freeman S, [place] JerseyPA-C 10/20/18 0011    Maia PlanLong, Joshua G, MD 10/20/18 1735

## 2018-10-19 NOTE — Discharge Instructions (Signed)
You should follow up with your primary healthcare provider within the next 3-5 days for reevaluation. ° °You will need to return to the emergency department immediately if you experience any of the following symptoms: ° °Difficulty breathing or shortness or breath °Pain or pressure in the chest or abdomen °Sudden dizziness °Confusion °Severe or persistent vomiting °Flu-like symptoms that improve but then return with fever or worse cough ° °You should stay home for at least 24 hours after your fever is gone except to get medical care or other necessities. Your fever should be gone without the need to use a fever-reducing medicine, such as Tylenol or Motrin. Until then, you should stay home from work, school, travel, shopping, social events, and public gatherings. ° °Stay away from others as much as possible to keep from infecting them. If you must leave home, for example to get medical care, wear a facemask if you have one, or cover coughs and sneezes with a tissue. Wash your hands often to keep from spreading flu to others ° °

## 2018-10-19 NOTE — ED Notes (Signed)
ED Provider at bedside. 

## 2018-10-19 NOTE — ED Triage Notes (Signed)
Fever, aches, chills, cough, congestion for over 1 week

## 2018-10-19 NOTE — ED Notes (Signed)
Pt comfortable and eating soup that he purchased before coming to ED.

## 2018-11-17 ENCOUNTER — Other Ambulatory Visit: Payer: Self-pay

## 2018-11-17 ENCOUNTER — Encounter (HOSPITAL_BASED_OUTPATIENT_CLINIC_OR_DEPARTMENT_OTHER): Payer: Self-pay

## 2018-11-17 ENCOUNTER — Emergency Department (HOSPITAL_BASED_OUTPATIENT_CLINIC_OR_DEPARTMENT_OTHER)
Admission: EM | Admit: 2018-11-17 | Discharge: 2018-11-17 | Discharge status: Against Medical Advice | Payer: 59 | Attending: Emergency Medicine | Admitting: Emergency Medicine

## 2018-11-17 DIAGNOSIS — Z5321 Procedure and treatment not carried out due to patient leaving prior to being seen by health care provider: Secondary | ICD-10-CM | POA: Insufficient documentation

## 2018-11-17 DIAGNOSIS — H5710 Ocular pain, unspecified eye: Secondary | ICD-10-CM | POA: Diagnosis present

## 2018-11-17 NOTE — ED Triage Notes (Signed)
Pt feels like there is something in his left eye, has tried unsuccessfully to flush foreign object out

## 2019-03-16 ENCOUNTER — Emergency Department (HOSPITAL_BASED_OUTPATIENT_CLINIC_OR_DEPARTMENT_OTHER)
Admission: EM | Admit: 2019-03-16 | Discharge: 2019-03-16 | Disposition: A | Discharge status: Home / Self Care | Payer: 59 | Attending: Emergency Medicine | Admitting: Emergency Medicine

## 2019-03-16 ENCOUNTER — Other Ambulatory Visit: Payer: Self-pay

## 2019-03-16 ENCOUNTER — Encounter (HOSPITAL_BASED_OUTPATIENT_CLINIC_OR_DEPARTMENT_OTHER): Payer: Self-pay | Admitting: *Deleted

## 2019-03-16 DIAGNOSIS — Z79899 Other long term (current) drug therapy: Secondary | ICD-10-CM | POA: Insufficient documentation

## 2019-03-16 DIAGNOSIS — R197 Diarrhea, unspecified: Secondary | ICD-10-CM | POA: Insufficient documentation

## 2019-03-16 MED ORDER — LOPERAMIDE HCL 2 MG PO CAPS
4.0000 mg | ORAL_CAPSULE | Freq: Once | ORAL | Status: AC
  Administered 2019-03-16: 4 mg via ORAL
  Filled 2019-03-16: qty 2

## 2019-03-16 MED ORDER — ONDANSETRON 4 MG PO TBDP: 4.0000 mg | ORAL_TABLET | Freq: Three times a day (TID) | ORAL | 0 refills | Status: DC | PRN

## 2019-03-16 MED ORDER — LACTATED RINGERS IV BOLUS
1000.0000 mL | Freq: Once | INTRAVENOUS | Status: AC
  Administered 2019-03-16: 1000 mL via INTRAVENOUS

## 2019-03-16 MED ORDER — ONDANSETRON HCL 4 MG/2ML IJ SOLN
4.0000 mg | Freq: Once | INTRAMUSCULAR | Status: AC
  Administered 2019-03-16: 4 mg via INTRAVENOUS
  Filled 2019-03-16: qty 2

## 2019-03-16 NOTE — ED Triage Notes (Signed)
Woke this am with nausea and diarrhea. Abdominal cramps.

## 2019-03-16 NOTE — ED Notes (Signed)
Fluid challenge completed no nausea or vomiting, small loose stool episode after the fluid challenge.

## 2019-03-16 NOTE — ED Provider Notes (Addendum)
[facility] EMERGENCY DEPARTMENT Provider Note   CSN: [id number] Arrival date & time: 03/16/19  [id number]    History   Chief Complaint No chief complaint on file.   HPI Edgar Gutierrez is a 48 y.o. male.     The history is provided by the patient.  Diarrhea  Quality:  Copious and watery Severity:  Severe Onset quality:  Sudden Number of episodes:  >10 Duration:  12 hours Timing:  Intermittent Progression:  Unchanged Relieved by:  None tried Worsened by:  Nothing Ineffective treatments:  None tried Associated symptoms: chills   Associated symptoms: no abdominal pain, no recent cough, no fever, no headaches, no myalgias, no URI and no vomiting   Associated symptoms comment:  Nausea Risk factors: suspect food intake   Risk factors comment:  Wife at home with same thing.  pt thinks it is food related due to eating at a funeral and food had been sitting out for awhile.   Past Medical History:  Diagnosis Date  . GERD (gastroesophageal reflux disease)   . IBS (irritable bowel syndrome)     There are no active problems to display for this patient.   Past Surgical History:  Procedure Laterality Date  . KNEE SURGERY          Home Medications    Prior to Admission medications   Medication Sig Start Date End Date Taking? Authorizing Provider  omeprazole (PRILOSEC) 40 MG capsule Take 40 mg by mouth daily.   Yes [provider]  cetirizine (ZYRTEC) 10 MG tablet Take 1 tablet (10 mg total) by mouth daily. 02/06/18   Georgiana ShoreMitchell, Jessica B, PA-C  diphenhydrAMINE (BENADRYL) 25 MG tablet Take 1 tablet (25 mg total) by mouth every 6 (six) hours. 01/25/13   Mabe, Latanya MaudlinMartha L, MD  fluticasone (FLONASE) 50 MCG/ACT nasal spray Place 1 spray into both nostrils daily. 02/06/18   Georgiana ShoreMitchell, Jessica B, PA-C  guaiFENesin (MUCINEX) 600 MG 12 hr tablet Take 1 tablet (600 mg total) by mouth 2 (two) times daily. 10/19/18   Couture, Cortni S, PA-C  ibuprofen (ADVIL,MOTRIN) 800 MG tablet  Take 1 tablet (800 mg total) by mouth 3 (three) times daily. 07/17/14   Elpidio AnisUpstill, Shari, PA-C  sildenafil (VIAGRA) 25 MG tablet Take 25 mg by mouth as needed for erectile dysfunction.    [provider]    Family History No family history on file.  Social History Social History   Tobacco Use  . Smoking status: Never Smoker  . Smokeless tobacco: Never Used  Substance Use Topics  . Alcohol use: Yes    Comment: occ  . Drug use: No     Allergies   Bee venom   Review of Systems Review of Systems  Constitutional: Positive for chills. Negative for fever.  Gastrointestinal: Positive for diarrhea. Negative for abdominal pain and vomiting.  Musculoskeletal: Negative for myalgias.  Neurological: Negative for headaches.  All other systems reviewed and are negative.    Physical Exam Updated Vital Signs BP (!) 132/93   Pulse (!) 121   Temp 98 F (36.7 C) (Oral)   Resp 20   Ht 5\' 10"  (1.778 m)   Wt 86.2 kg   SpO2 100%   BMI 27.26 kg/m   Physical Exam Vitals signs and nursing note reviewed.  Constitutional:      General: He is not in acute distress.    Appearance: He is well-developed.  HENT:     Head: Normocephalic and atraumatic.  Mouth/Throat:     Mouth: Mucous membranes are dry.  Eyes:     Conjunctiva/sclera: Conjunctivae normal.     Pupils: Pupils are equal, round, and reactive to light.  Neck:     Musculoskeletal: Normal range of motion and neck supple.  Cardiovascular:     Rate and Rhythm: Regular rhythm. Tachycardia present.     Heart sounds: No murmur.  Pulmonary:     Effort: Pulmonary effort is normal. No respiratory distress.     Breath sounds: Normal breath sounds. No wheezing or rales.  Abdominal:     General: There is no distension.     Palpations: Abdomen is soft.     Tenderness: There is no abdominal tenderness. There is no guarding or rebound.  Musculoskeletal: Normal range of motion.        General: No tenderness.  Skin:     General: Skin is warm and dry.     Findings: No erythema or rash.  Neurological:     Mental Status: He is alert and oriented to person, place, and time.  Psychiatric:        Mood and Affect: Mood normal.        Behavior: Behavior normal.        Thought Content: Thought content normal.      ED Treatments / Results  Labs (all labs ordered are listed, but only abnormal results are displayed) Labs Reviewed  GASTROINTESTINAL PANEL BY PCR, STOOL (REPLACES STOOL CULTURE)    EKG None  Radiology No results found.  Procedures Procedures (including critical care time)  Medications Ordered in ED Medications  lactated ringers bolus 1,000 mL (1,000 mLs Intravenous New Bag/Given 03/16/19 2013)  ondansetron (ZOFRAN) injection 4 mg (4 mg Intravenous Given 03/16/19 2014)     Initial Impression / Assessment and Plan / ED Course  I have reviewed the triage vital signs and the nursing notes.  Pertinent labs & imaging results that were available during my care of the patient were reviewed by me and considered in my medical decision making (see chart for details).       Pt with symptoms most consistent with a viral process vs food borne illness with nausea/diarrhea.  Bad food exposure and wife home sick with the same thing. No recent travel out of the country.  No recent abx.  No hx concerning for GU pathology or kidney stones.  Pt is awake and alert on exam without peritoneal signs. Pt given IVF and nausea meds.  GI pathogen panel sent. Pt tolerating po's and d/ced home.   Final Clinical Impressions(s) / ED Diagnoses   Final diagnoses:  Diarrhea of presumed infectious origin    ED Discharge Orders         Ordered    ondansetron (ZOFRAN ODT) 4 MG disintegrating tablet  Every 8 hours PRN     03/16/19 2213           Gwyneth Sprout, MD 03/16/19 2154    Gwyneth Sprout, MD 03/16/19 2214

## 2019-03-17 LAB — GASTROINTESTINAL PANEL BY PCR, STOOL (REPLACES STOOL CULTURE)

## 2019-03-23 IMAGING — DX DG CHEST 2V
2 series · 2 of 2 positions shown · non-contrast
Comparison: 07/24/2018

CLINICAL DATA: Low-grade fever with aches. Sinus congestion x2
weeks.

EXAM:
CHEST - 2 VIEW

[chest pa]
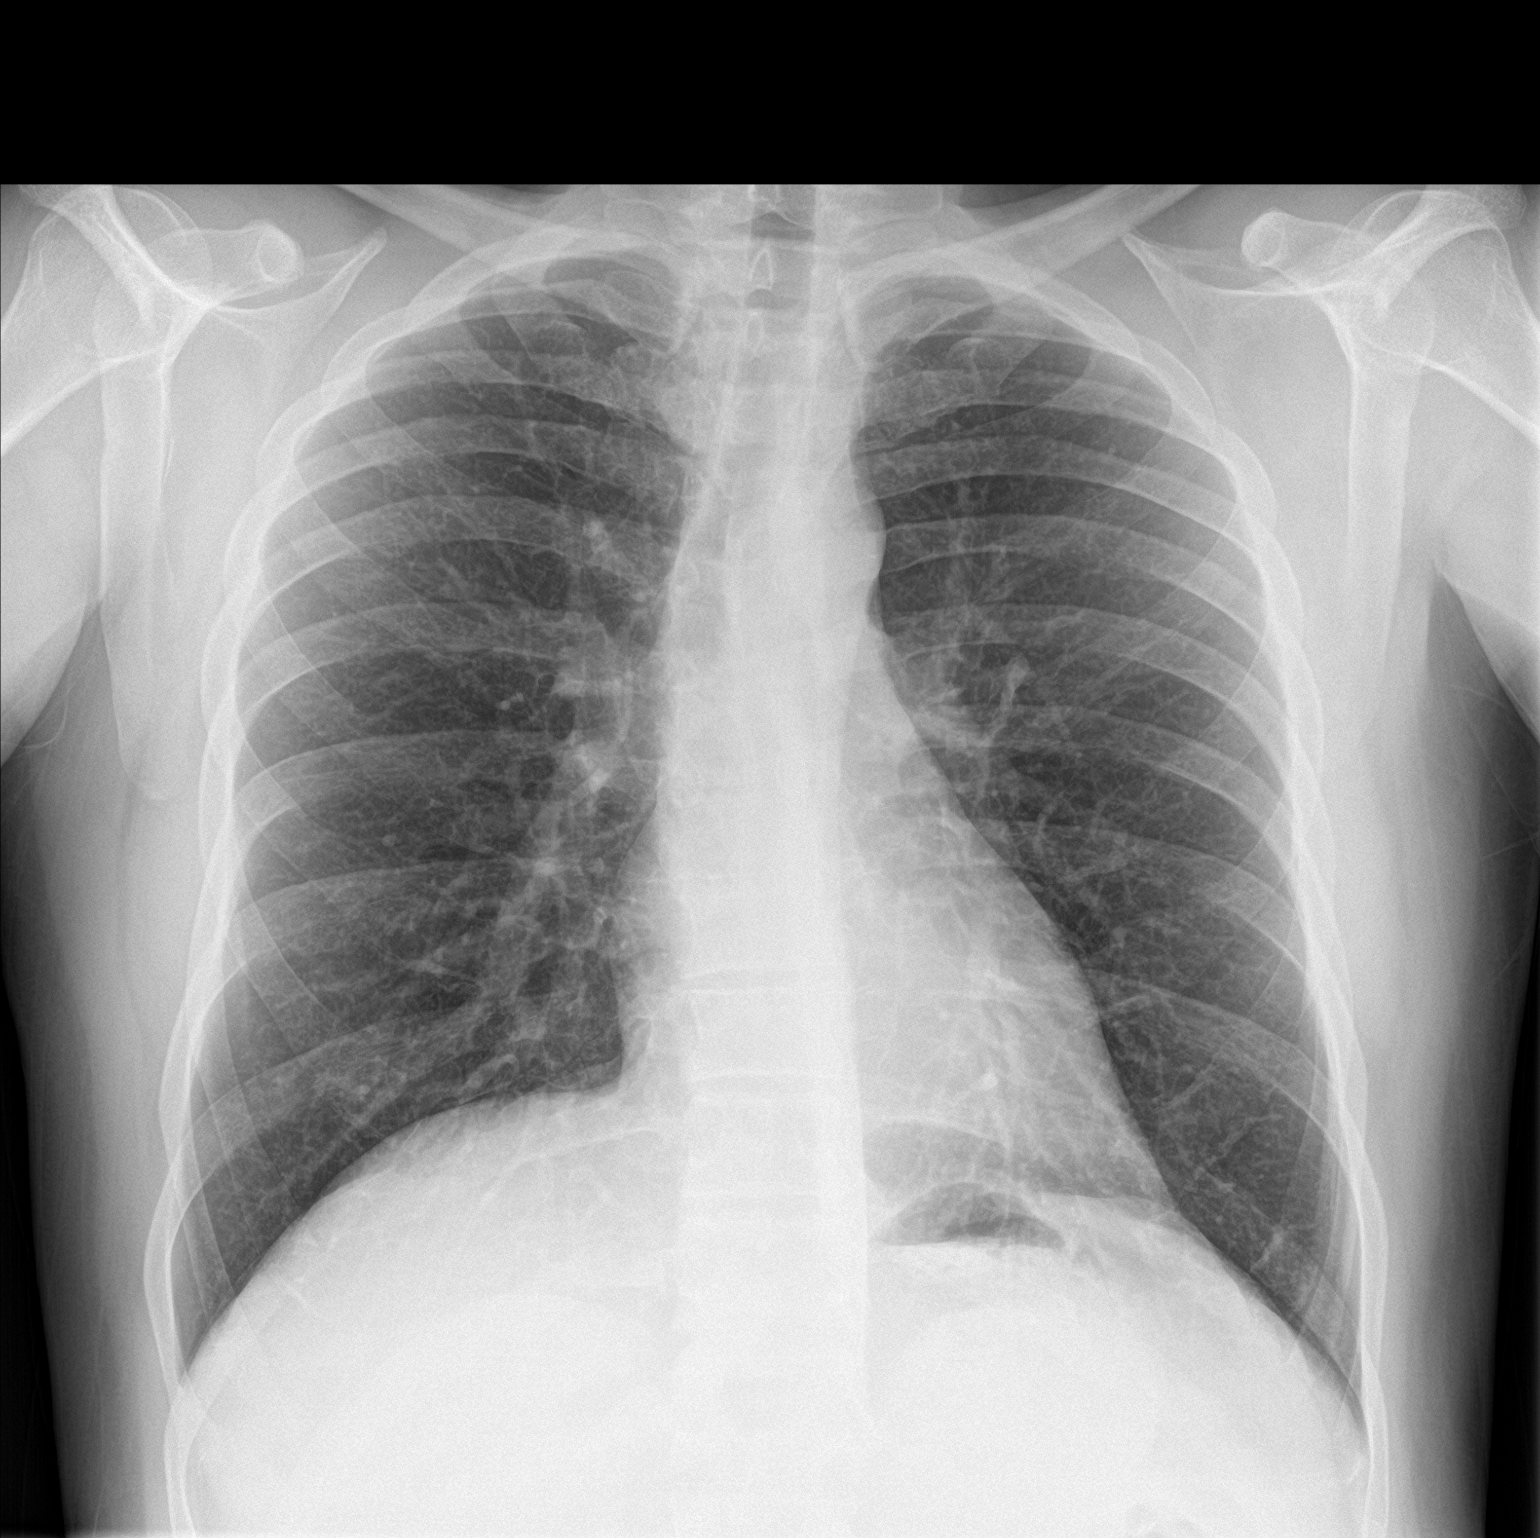

[chest lat]
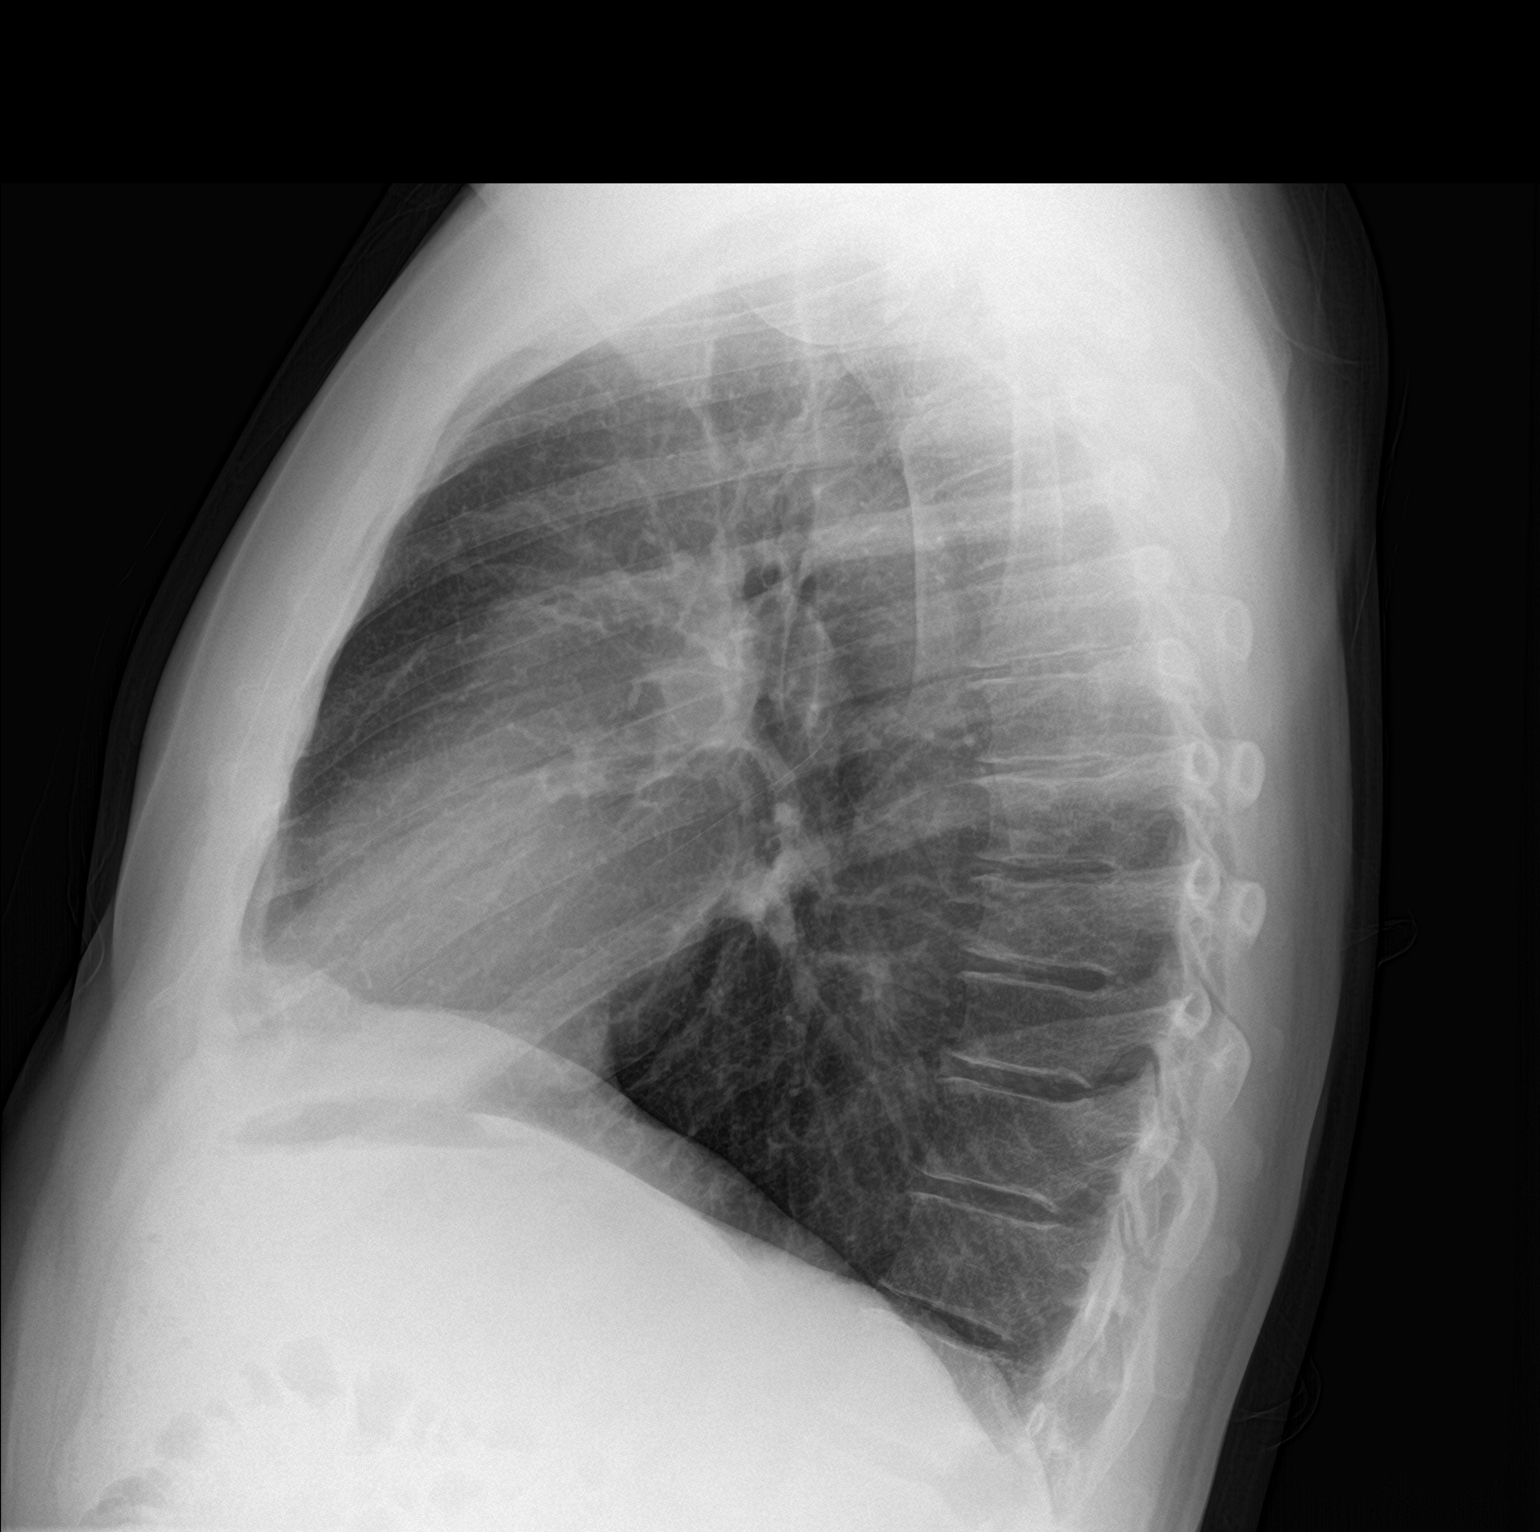

[2 of 2 positions shown; findings below may reference images not displayed]

FINDINGS: The heart size and mediastinal contours are within normal limits.
Both lungs are clear. The visualized skeletal structures are
unremarkable.
IMPRESSION: No active cardiopulmonary disease.

## 2019-05-29 ENCOUNTER — Other Ambulatory Visit: Payer: Self-pay

## 2019-05-29 ENCOUNTER — Encounter (HOSPITAL_BASED_OUTPATIENT_CLINIC_OR_DEPARTMENT_OTHER): Payer: Self-pay | Admitting: *Deleted

## 2019-05-29 ENCOUNTER — Emergency Department (HOSPITAL_BASED_OUTPATIENT_CLINIC_OR_DEPARTMENT_OTHER)
Admission: EM | Admit: 2019-05-29 | Discharge: 2019-05-29 | Disposition: A | Discharge status: Home / Self Care | Payer: 59 | Attending: Emergency Medicine | Admitting: Emergency Medicine

## 2019-05-29 DIAGNOSIS — W57XXXA Bitten or stung by nonvenomous insect and other nonvenomous arthropods, initial encounter: Secondary | ICD-10-CM | POA: Diagnosis not present

## 2019-05-29 DIAGNOSIS — S80862A Insect bite (nonvenomous), left lower leg, initial encounter: Secondary | ICD-10-CM | POA: Diagnosis not present

## 2019-05-29 DIAGNOSIS — Z79899 Other long term (current) drug therapy: Secondary | ICD-10-CM | POA: Diagnosis not present

## 2019-05-29 DIAGNOSIS — Y998 Other external cause status: Secondary | ICD-10-CM | POA: Diagnosis not present

## 2019-05-29 DIAGNOSIS — Y929 Unspecified place or not applicable: Secondary | ICD-10-CM | POA: Insufficient documentation

## 2019-05-29 DIAGNOSIS — Y9389 Activity, other specified: Secondary | ICD-10-CM | POA: Diagnosis not present

## 2019-05-29 DIAGNOSIS — S80861A Insect bite (nonvenomous), right lower leg, initial encounter: Secondary | ICD-10-CM | POA: Diagnosis not present

## 2019-05-29 MED ORDER — DOXYCYCLINE HYCLATE 100 MG PO CAPS: 100.0000 mg | ORAL_CAPSULE | Freq: Two times a day (BID) | ORAL | 0 refills | Status: DC

## 2019-05-29 MED ORDER — DOXYCYCLINE HYCLATE 100 MG PO TABS
100.0000 mg | ORAL_TABLET | Freq: Once | ORAL | Status: AC
  Administered 2019-05-29: 100 mg via ORAL
  Filled 2019-05-29: qty 1

## 2019-05-29 NOTE — Discharge Instructions (Signed)
He has multiple bites and may have a reaction or early infection.   Take doxycycline twice daily for a week.  You may take Benadryl 25 mg every 6 hours for itchiness.  See your doctor.  Return to the ER if you have fever, worsening rash, purulent discharge from the bite sites.

## 2019-05-29 NOTE — ED Triage Notes (Signed)
Pt c/o multiple insect bites x 3 weeks ago c/o itching

## 2019-05-29 NOTE — ED Provider Notes (Signed)
[facility] EMERGENCY DEPARTMENT Provider Note   CSN: [id number] Arrival date & time: 05/29/19  2001     History   Chief Complaint Chief Complaint  Patient presents with  . Insect Bite    HPI Edgar Gutierrez is a 48 y.o. male hx of IBS, here presenting with multiple tick bites.  Patient states that he noticed multiple tick bites on bilateral lower extremity about a week and half ago .  He is able to remove them but he does not know how long they have been on it.  He states that the bite sites remain red but they are not spreading .  No purulent discharge or fever .  States that they have is very itchy as well.  Otherwise healthy and denies anybody else having a rash.     The history is provided by the patient.    Past Medical History:  Diagnosis Date  . GERD (gastroesophageal reflux disease)   . IBS (irritable bowel syndrome)     There are no active problems to display for this patient.   Past Surgical History:  Procedure Laterality Date  . KNEE SURGERY          Home Medications    Prior to Admission medications   Medication Sig Start Date End Date Taking? Authorizing Provider  cetirizine (ZYRTEC) 10 MG tablet Take 1 tablet (10 mg total) by mouth daily. 02/06/18   Georgiana ShoreMitchell, Jessica B, PA-C  diphenhydrAMINE (BENADRYL) 25 MG tablet Take 1 tablet (25 mg total) by mouth every 6 (six) hours. 01/25/13   Mabe, Latanya MaudlinMartha L, MD  doxycycline (VIBRAMYCIN) 100 MG capsule Take 1 capsule (100 mg total) by mouth 2 (two) times daily. One po bid x 7 days 05/29/19   Charlynne PanderYao,  Hsienta, MD  fluticasone [facility](FLONASE) 50 MCG/ACT nasal spray Place 1 spray into both nostrils daily. 02/06/18   Georgiana ShoreMitchell, Jessica B, PA-C  guaiFENesin (MUCINEX) 600 MG 12 hr tablet Take 1 tablet (600 mg total) by mouth 2 (two) times daily. 10/19/18   Couture, Cortni S, PA-C  ibuprofen (ADVIL,MOTRIN) 800 MG tablet Take 1 tablet (800 mg total) by mouth 3 (three) times daily. 07/17/14   Elpidio AnisUpstill, Shari, PA-C  omeprazole  (PRILOSEC) 40 MG capsule Take 40 mg by mouth daily.    [provider]  ondansetron (ZOFRAN ODT) 4 MG disintegrating tablet Take 1 tablet (4 mg total) by mouth every 8 (eight) hours as needed for nausea or vomiting. 03/16/19   Gwyneth SproutPlunkett, Whitney, MD  sildenafil (VIAGRA) 25 MG tablet Take 25 mg by mouth as needed for erectile dysfunction.    [provider]    Family History No family history on file.  Social History Social History   Tobacco Use  . Smoking status: Never Smoker  . Smokeless tobacco: Never Used  Substance Use Topics  . Alcohol use: Yes    Comment: occ  . Drug use: No     Allergies   Bee venom   Review of Systems Review of Systems  Musculoskeletal: Negative for arthralgias.  Skin: Positive for rash.  All other systems reviewed and are negative.    Physical Exam Updated Vital Signs BP (!) 137/93 (BP Location: Left Arm)   Pulse 78   Temp 98.6 F (37 C) (Oral)   Resp 18   Ht 5\' 10"  (1.778 m)   Wt 86.2 kg   SpO2 100%   BMI 27.26 kg/m   Physical Exam Vitals signs and nursing note reviewed.  HENT:  Head: Normocephalic.     Nose: Nose normal.     Mouth/Throat:     Mouth: Mucous membranes are moist.  Eyes:     Pupils: Pupils are equal, round, and reactive to light.  Neck:     Musculoskeletal: Normal range of motion.  Cardiovascular:     Rate and Rhythm: Normal rate.     Pulses: Normal pulses.  Pulmonary:     Effort: Pulmonary effort is normal.  Abdominal:     General: Abdomen is flat.  Musculoskeletal: Normal range of motion.  Skin:    General: Skin is warm.     Capillary Refill: Capillary refill takes less than 2 seconds.     Comments: Multiple bite sites on lower extremities. No petechiae, no erythema migraines. No purpura.   Neurological:     General: No focal deficit present.     Mental Status: He is alert.  Psychiatric:        Mood and Affect: Mood normal.      ED Treatments / Results  Labs (all labs ordered  are listed, but only abnormal results are displayed) Labs Reviewed - No data to display  EKG None  Radiology No results found.  Procedures Procedures (including critical care time)  Medications Ordered in ED Medications  doxycycline (VIBRA-TABS) tablet 100 mg (has no administration in time range)     Initial Impression / Assessment and Plan / ED Course  I have reviewed the triage vital signs and the nursing notes.  Pertinent labs & imaging results that were available during my care of the patient were reviewed by me and considered in my medical decision making (see chart for details).       Edgar Gutierrez is a 48 y.o. male here presenting with multiple insect bites.  He states that they are tick bites I am unclear if it is a local reaction or allergic reaction or early RMSF.  He is afebrile and well-appearing.  Will give a course of doxycycline and I recommend taking Benadryl as needed as well.    Final Clinical Impressions(s) / ED Diagnoses   Final diagnoses:  Tick bite, initial encounter    ED Discharge Orders         Ordered    doxycycline (VIBRAMYCIN) 100 MG capsule  2 times daily     05/29/19 2124           Drenda Freeze, MD 05/29/19 2128

## 2020-04-24 ENCOUNTER — Emergency Department (HOSPITAL_BASED_OUTPATIENT_CLINIC_OR_DEPARTMENT_OTHER)
Admission: EM | Admit: 2020-04-24 | Discharge: 2020-04-24 | Disposition: A | Discharge status: Home / Self Care | Payer: 59 | Attending: Emergency Medicine | Admitting: Emergency Medicine

## 2020-04-24 ENCOUNTER — Encounter (HOSPITAL_BASED_OUTPATIENT_CLINIC_OR_DEPARTMENT_OTHER): Payer: Self-pay

## 2020-04-24 ENCOUNTER — Other Ambulatory Visit: Payer: Self-pay

## 2020-04-24 DIAGNOSIS — L255 Unspecified contact dermatitis due to plants, except food: Secondary | ICD-10-CM

## 2020-04-24 DIAGNOSIS — J029 Acute pharyngitis, unspecified: Secondary | ICD-10-CM | POA: Diagnosis not present

## 2020-04-24 DIAGNOSIS — R21 Rash and other nonspecific skin eruption: Secondary | ICD-10-CM | POA: Diagnosis present

## 2020-04-24 DIAGNOSIS — Z20822 Contact with and (suspected) exposure to covid-19: Secondary | ICD-10-CM | POA: Insufficient documentation

## 2020-04-24 LAB — GROUP A STREP BY PCR: Group A Strep by PCR: NOT DETECTED

## 2020-04-24 LAB — SARS CORONAVIRUS 2 BY RT PCR (HOSPITAL ORDER, PERFORMED IN ~~LOC~~ HOSPITAL LAB): SARS Coronavirus 2: NEGATIVE

## 2020-04-24 MED ORDER — HYDROCORTISONE 1 % EX CREA: TOPICAL_CREAM | CUTANEOUS | 0 refills | Status: DC

## 2020-04-24 MED ORDER — DEXAMETHASONE SODIUM PHOSPHATE 10 MG/ML IJ SOLN
10.0000 mg | Freq: Once | INTRAMUSCULAR | Status: AC
  Administered 2020-04-24: 10 mg via INTRAMUSCULAR
  Filled 2020-04-24: qty 1

## 2020-04-24 NOTE — ED Triage Notes (Signed)
Pt arrives with c/o rash to hands, waist, and face reports doing yard work yesterday. Pt also c/o sore throat.

## 2020-04-24 NOTE — ED Provider Notes (Signed)
[facility] Emergency Department Provider Note MRN:  [id number]  Arrival date & time: 04/24/20     Chief Complaint   Sore Throat and Rash   History of Present Illness   Edgar Gutierrez is a 49 y.o. year-old male with a history of GERD presenting to the ED with chief complaint of rash.  Patient was picking weeds 2 days ago, yesterday began experiencing redness, itching, rash to the hands and bilateral groin regions.  Thinks he was exposed to poison ivy or poison oak.  Denies any fever, no nausea vomiting or diarrhea, no chest pain or shortness of breath, no abdominal pain.  Also experiencing mild sore throat for the past 3 to 5 days, patient's wife at home has similar symptom, suspects virus or strep throat.  Review of Systems  A complete 10 system review of systems was obtained and all systems are negative except as noted in the HPI and PMH.   Patient's Health History    Past Medical History:  Diagnosis Date  . GERD (gastroesophageal reflux disease)   . IBS (irritable bowel syndrome)     Past Surgical History:  Procedure Laterality Date  . KNEE SURGERY      No family history on file.  Social History   Socioeconomic History  . Marital status: Married    Spouse name: Not on file  . Number of children: Not on file  . Years of education: Not on file  . Highest education level: Not on file  Occupational History  . Not on file  Tobacco Use  . Smoking status: Never Smoker  . Smokeless tobacco: Never Used  Vaping Use  . Vaping Use: Never used  Substance and Sexual Activity  . Alcohol use: Yes    Comment: occ  . Drug use: No  . Sexual activity: Not on file  Other Topics Concern  . Not on file  Social History Narrative  . Not on file   Social Determinants of Health   Financial Resource Strain:   . Difficulty of Paying Living Expenses:   Food Insecurity:   . Worried About Programme researcher, broadcasting/film/video in the Last Year:   . Barista in the Last  Year:   Transportation Needs:   . Freight forwarder (Medical):   Marland Kitchen Lack of Transportation (Non-Medical):   Physical Activity:   . Days of Exercise per Week:   . Minutes of Exercise per Session:   Stress:   . Feeling of Stress :   Social Connections:   . Frequency of Communication with Friends and Family:   . Frequency of Social Gatherings with Friends and Family:   . Attends Religious Services:   . Active Member of Clubs or Organizations:   . Attends Banker Meetings:   Marland Kitchen Marital Status:   Intimate Partner Violence:   . Fear of Current or Ex-Partner:   . Emotionally Abused:   Marland Kitchen Physically Abused:   . Sexually Abused:      Physical Exam   Vitals:   04/24/20 1820 04/24/20 2102  BP: 116/61 112/86  Pulse: 86 63  Resp: 18 20  Temp: 98.1 F (36.7 C)   SpO2: 97% 98%    CONSTITUTIONAL: Well-appearing, NAD NEURO:  Alert and oriented x 3, no focal deficits EYES:  eyes equal and reactive ENT/NECK:  no LAD, no JVD, normal-appearing posterior oropharynx CARDIO: Regular rate, well-perfused, normal S1 and S2 PULM:  CTAB no wheezing or rhonchi GI/GU:  normal bowel sounds, non-distended, non-tender MSK/SPINE:  No gross deformities, no edema SKIN:  no rash, atraumatic PSYCH:  Appropriate speech and behavior  *Additional and/or pertinent findings included in MDM below  Diagnostic and Interventional Summary    EKG Interpretation  Date/Time:    Ventricular Rate:    PR Interval:    QRS Duration:   QT Interval:    QTC Calculation:   R Axis:     Text Interpretation:        Labs Reviewed  GROUP A STREP BY PCR  SARS CORONAVIRUS 2 BY RT PCR (HOSPITAL ORDER, PERFORMED IN [facility])    No orders to display    Medications  dexamethasone (DECADRON) injection 10 mg (has no administration in time range)     Procedures  /  Critical Care Procedures  ED Course and Medical Decision Making  I have reviewed the triage vital signs, the nursing  notes, and pertinent available records from the EMR.  Listed above are laboratory and imaging tests that I personally ordered, reviewed, and interpreted and then considered in my medical decision making (see below for details).      Sore throat, rapid strep is negative, no signs of significant infection on exam, favored viral, will swab for Covid.  Patient is not vaccinated.  Contact dermatitis, treating with steroid injection and cream.    Elmer Sow. Pilar Plate, MD [facility] Emergency Medicine [facility] [email or web address]  Final Clinical Impressions(s) / ED Diagnoses     ICD-10-CM   1. Contact dermatitis due to plants, except food, unspecified contact dermatitis type  L25.5     ED Discharge Orders         Ordered    hydrocortisone cream 1 %     Discontinue  Reprint     04/24/20 2203           Discharge Instructions Discussed with and Provided to Patient:     Discharge Instructions     You were evaluated in the Emergency Department and after careful evaluation, we did not find any emergent condition requiring admission or further testing in the hospital.  Your exam/testing today was overall reassuring.  We suspect your sore throat was due to a virus.  We are testing you here in the emergency department for the coronavirus, please avoid close contact with others until you receive a negative test result.  If positive, you will need to home isolate for 10 days.  Your rash seems consistent with contact dermatitis related to poison ivy or poison oak.  We provided you with a steroid injection here in the emergency department, please use the hydrocortisone cream twice daily as needed for the next few days.  Please return to the Emergency Department if you experience any worsening of your condition.  Thank you for allowing [place] to be a part of your care.       Sabas Sous, MD 04/24/20 2207

## 2020-04-24 NOTE — Discharge Instructions (Addendum)
You were evaluated in the Emergency Department and after careful evaluation, we did not find any emergent condition requiring admission or further testing in the hospital.  Your exam/testing today was overall reassuring.  We suspect your sore throat was due to a virus.  We are testing you here in the emergency department for the coronavirus, please avoid close contact with others until you receive a negative test result.  If positive, you will need to home isolate for 10 days.  Your rash seems consistent with contact dermatitis related to poison ivy or poison oak.  We provided you with a steroid injection here in the emergency department, please use the hydrocortisone cream twice daily as needed for the next few days.  Please return to the Emergency Department if you experience any worsening of your condition.  Thank you for allowing [place] to be a part of your care.

## 2022-10-10 ENCOUNTER — Ambulatory Visit
Admission: EM | Admit: 2022-10-10 | Discharge: 2022-10-10 | Disposition: A | Discharge status: Home / Self Care | Payer: 59 | Attending: Physician Assistant | Admitting: Physician Assistant

## 2022-10-10 DIAGNOSIS — J069 Acute upper respiratory infection, unspecified: Secondary | ICD-10-CM | POA: Diagnosis present

## 2022-10-10 DIAGNOSIS — Z1152 Encounter for screening for COVID-19: Secondary | ICD-10-CM | POA: Diagnosis present

## 2022-10-10 DIAGNOSIS — M25531 Pain in right wrist: Secondary | ICD-10-CM | POA: Insufficient documentation

## 2022-10-10 MED ORDER — PREDNISONE 20 MG PO TABS: 40.0000 mg | ORAL_TABLET | Freq: Every day | ORAL | 0 refills | Status: AC

## 2022-10-10 MED ORDER — OSELTAMIVIR PHOSPHATE 75 MG PO CAPS: 75.0000 mg | ORAL_CAPSULE | Freq: Two times a day (BID) | ORAL | 0 refills | Status: AC

## 2022-10-10 NOTE — ED Triage Notes (Signed)
Pt c/o body aches, malaise, fatigue, cough, nasal congestion   Onset ~ Monday   Denies pain in triage

## 2022-10-10 NOTE — ED Provider Notes (Signed)
[facility]    CSN: [id number] Arrival date & time: 10/10/22  1505      History   Chief Complaint Chief Complaint  Patient presents with   Cough    HPI Lucia Mccreadie is a 51 y.o. male.   Patient here today for evaluation of cough and congestion with low-grade fever that he has had for the last few days.  He reports that he has taken an at home COVID test that was negative.  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  He also notes his daughter has strep throat.  He does endorse some sore throat as well.  He has not had any vomiting but has had some diarrhea as he has been taking azithromycin.  As I am walking out of the room he also reports some pain to his right wrist that started after he was trying to buckle his child in the car seat.  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  He denies any other recent injury.  Movement continues to make pain worse.  The history is provided by the patient.    Past Medical History:  Diagnosis Date   GERD (gastroesophageal reflux disease)    IBS (irritable bowel syndrome)     There are no problems to display for this patient.   Past Surgical History:  Procedure Laterality Date   KNEE SURGERY         Home Medications    Prior to Admission medications   Medication Sig Start Date End Date Taking? Authorizing Provider  oseltamivir (TAMIFLU) 75 MG capsule Take 1 capsule (75 mg total) by mouth every 12 (twelve) hours. 10/10/22  Yes Tomi Bamberger, PA-C  predniSONE (DELTASONE) 20 MG tablet Take 2 tablets (40 mg total) by mouth daily with breakfast for 5 days. 10/10/22 10/15/22 Yes Tomi Bamberger, PA-C  cetirizine (ZYRTEC) 10 MG tablet Take 1 tablet (10 mg total) by mouth daily. 02/06/18   Georgiana Shore, PA-C  diphenhydrAMINE (BENADRYL) 25 MG tablet Take 1 tablet (25 mg total) by mouth every 6 (six) hours. 01/25/13   Mabe, Latanya Maudlin, MD  doxycycline (VIBRAMYCIN) 100 MG capsule Take 1 capsule (100 mg  total) by mouth 2 (two) times daily. One po bid x 7 days 05/29/19   Charlynne Pander, MD  fluticasone [facility]) 50 MCG/ACT nasal spray Place 1 spray into both nostrils daily. 02/06/18   Georgiana Shore, PA-C  guaiFENesin (MUCINEX) 600 MG 12 hr tablet Take 1 tablet (600 mg total) by mouth 2 (two) times daily. 10/19/18   Couture, Cortni S, PA-C  hydrocortisone cream 1 % Apply to affected area 2 times daily 04/24/20   Sabas Sous, MD  ibuprofen (ADVIL,MOTRIN) 800 MG tablet Take 1 tablet (800 mg total) by mouth 3 (three) times daily. 07/17/14   Elpidio Anis, PA-C  omeprazole (PRILOSEC) 40 MG capsule Take 40 mg by mouth daily.    [provider]  ondansetron (ZOFRAN ODT) 4 MG disintegrating tablet Take 1 tablet (4 mg total) by mouth every 8 (eight) hours as needed for nausea or vomiting. 03/16/19   Gwyneth Sprout, MD  sildenafil (VIAGRA) 25 MG tablet Take 25 mg by mouth as needed for erectile dysfunction.    [provider]    Family History History reviewed. No pertinent family history.  Social History Social History   Tobacco Use   Smoking status: Never   Smokeless tobacco: Never  Vaping Use  Vaping Use: Never used  Substance Use Topics   Alcohol use: Yes    Comment: occ   Drug use: No     Allergies   Bee venom   Review of Systems Review of Systems  Constitutional:  Positive for fever.  HENT:  Positive for congestion and sore throat. Negative for ear pain.   Eyes:  Negative for discharge and redness.  Respiratory:  Positive for cough. Negative for shortness of breath.   Gastrointestinal:  Positive for diarrhea. Negative for nausea and vomiting.  Musculoskeletal:  Positive for arthralgias. Negative for joint swelling.     Physical Exam Triage Vital Signs ED Triage Vitals  Enc Vitals Group     BP      Pulse      Resp      Temp      Temp src      SpO2      Weight      Height      Head Circumference      Peak Flow      Pain Score      Pain  Loc      Pain Edu?      Excl. in GC?    No data found.  Updated Vital Signs BP 107/72 (BP Location: Left Arm)   Pulse (!) 110   Temp 99.3 F (37.4 C) (Oral)   Resp 16   SpO2 97%      Physical Exam Vitals and nursing note reviewed.  Constitutional:      General: He is not in acute distress.    Appearance: Normal appearance. He is not ill-appearing.  HENT:     Head: Normocephalic and atraumatic.     Nose: Congestion present.     Mouth/Throat:     Mouth: Mucous membranes are moist.     Pharynx: Oropharynx is clear. No oropharyngeal exudate or posterior oropharyngeal erythema.  Eyes:     Conjunctiva/sclera: Conjunctivae normal.  Cardiovascular:     Rate and Rhythm: Normal rate and regular rhythm.     Heart sounds: Normal heart sounds. No murmur heard. Pulmonary:     Effort: Pulmonary effort is normal. No respiratory distress.     Breath sounds: Normal breath sounds. No wheezing, rhonchi or rales.  Musculoskeletal:     Comments: Relatively normal ROM of right wrist with pain noted with extremes in ROM  Skin:    General: Skin is warm and dry.  Neurological:     Mental Status: He is alert.  Psychiatric:        Mood and Affect: Mood normal.        Thought Content: Thought content normal.      UC Treatments / Results  Labs (all labs ordered are listed, but only abnormal results are displayed) Labs Reviewed  SARS CORONAVIRUS 2 (TAT 6-24 HRS)    EKG   Radiology No results found.  Procedures Procedures (including critical care time)  Medications Ordered in UC Medications - No data to display  Initial Impression / Assessment and Plan / UC Course  I have reviewed the triage vital signs and the nursing notes.  Pertinent labs & imaging results that were available during my care of the patient were reviewed by me and considered in my medical decision making (see chart for details).    Discussed likely viral etiology of upper respiratory symptoms.  Will screen  for COVID with PCR.  Discussed possibility of flu and will treat for same at patient's  request.  Unable to screen for RSV.  Discussed that treatment for RSV would be symptomatic.  Low suspicion of strep, but patient already taking azithromycin.  Will order steroid burst for right wrist pain and recommended follow-up with Ortho if symptoms persist.  Encouraged sooner follow-up with any worsening symptoms.  Final Clinical Impressions(s) / UC Diagnoses   Final diagnoses:  Acute upper respiratory infection  Encounter for screening for COVID-19  Right wrist pain   Discharge Instructions   None    ED Prescriptions     Medication Sig Dispense Auth. Provider   predniSONE (DELTASONE) 20 MG tablet Take 2 tablets (40 mg total) by mouth daily with breakfast for 5 days. 10 tablet Tomi Bamberger, PA-C   oseltamivir (TAMIFLU) 75 MG capsule Take 1 capsule (75 mg total) by mouth every 12 (twelve) hours. 10 capsule Tomi Bamberger, PA-C      PDMP not reviewed this encounter.   Tomi Bamberger, PA-C 10/10/22 1939

## 2022-10-12 LAB — SARS CORONAVIRUS 2 (TAT 6-24 HRS): SARS Coronavirus 2: NEGATIVE

## 2023-11-10 ENCOUNTER — Encounter (HOSPITAL_BASED_OUTPATIENT_CLINIC_OR_DEPARTMENT_OTHER): Payer: Self-pay | Admitting: Emergency Medicine

## 2023-11-10 ENCOUNTER — Other Ambulatory Visit: Payer: Self-pay

## 2023-11-10 ENCOUNTER — Emergency Department (HOSPITAL_BASED_OUTPATIENT_CLINIC_OR_DEPARTMENT_OTHER)
Admission: EM | Admit: 2023-11-10 | Discharge: 2023-11-10 | Disposition: A | Discharge status: Home / Self Care | Payer: 59 | Attending: Emergency Medicine | Admitting: Emergency Medicine

## 2023-11-10 DIAGNOSIS — Z20822 Contact with and (suspected) exposure to covid-19: Secondary | ICD-10-CM | POA: Insufficient documentation

## 2023-11-10 DIAGNOSIS — J069 Acute upper respiratory infection, unspecified: Secondary | ICD-10-CM | POA: Diagnosis not present

## 2023-11-10 DIAGNOSIS — R059 Cough, unspecified: Secondary | ICD-10-CM | POA: Diagnosis present

## 2023-11-10 LAB — RESP PANEL BY RT-PCR (RSV, FLU A&B, COVID)  RVPGX2
Influenza A by PCR: NEGATIVE
Influenza B by PCR: NEGATIVE
Resp Syncytial Virus by PCR: NEGATIVE
SARS Coronavirus 2 by RT PCR: NEGATIVE

## 2023-11-10 NOTE — ED Provider Notes (Signed)
[facility] Provider Note   CSN: [id number] Arrival date & time: 11/10/23  1317     History  Chief Complaint  Patient presents with   Influenza    Edgar Gutierrez is a 53 y.o. male with history of GERD, IBS, presents with concern for congestion for the past 2 or 3 days, also with general malaise and dry cough.  States his daughter tested positive for flu yesterday and he wanted to be tested.  Denies any fever or chills at home.  Denies any abdominal pain, nausea or vomiting, or diarrhea.   Influenza Presenting symptoms: fatigue   Associated symptoms: nasal congestion        Home Medications Prior to Admission medications   Medication Sig Start Date End Date Taking? Authorizing Provider  omeprazole (PRILOSEC) 40 MG capsule Take 40 mg by mouth daily.    [provider]  oseltamivir (TAMIFLU) 75 MG capsule Take 1 capsule (75 mg total) by mouth every 12 (twelve) hours. 10/10/22   Tomi Bamberger, PA-C  sildenafil (VIAGRA) 25 MG tablet Take 25 mg by mouth as needed for erectile dysfunction.    [provider]      Allergies    Bee venom    Review of Systems   Review of Systems  Constitutional:  Positive for fatigue.  HENT:  Positive for congestion.     Physical Exam Updated Vital Signs BP 128/80   Pulse 81   Temp 98.4 F (36.9 C)   Resp 18   Ht 5\' 10"  (1.778 m)   Wt 88.5 kg   SpO2 99%   BMI 27.98 kg/m  Physical Exam Vitals and nursing note reviewed.  Constitutional:      General: He is not in acute distress.    Appearance: He is well-developed.  HENT:     Head: Normocephalic and atraumatic.  Eyes:     Conjunctiva/sclera: Conjunctivae normal.  Cardiovascular:     Rate and Rhythm: Normal rate and regular rhythm.     Heart sounds: No murmur heard. Pulmonary:     Effort: Pulmonary effort is normal. No respiratory distress.     Breath sounds: Normal breath sounds.  Abdominal:     Palpations:  Abdomen is soft.     Tenderness: There is no abdominal tenderness.  Musculoskeletal:        General: No swelling.     Cervical back: Neck supple.  Skin:    General: Skin is warm and dry.     Capillary Refill: Capillary refill takes less than 2 seconds.  Neurological:     Mental Status: He is alert.  Psychiatric:        Mood and Affect: Mood normal.     ED Results / Procedures / Treatments   Labs (all labs ordered are listed, but only abnormal results are displayed) Labs Reviewed  RESP PANEL BY RT-PCR (RSV, FLU A&B, COVID)  [id number]    EKG None  Radiology No results found.  Procedures Procedures    Medications Ordered in ED Medications - No data to display  ED Course/ Medical Decision Making/ A&P                                 Medical Decision Making    Differential diagnosis includes but is not limited to COVID, flu, RSV, viral URI, strep pharyngitis, viral pharyngitis, allergic rhinitis, pneumonia, bronchitis   ED  Course:  Patient very well-appearing, stable vital signs.  Reports he has been having some congestion for the past couple days as well as a dry cough that started this morning.  Flu, COVID, RSV negative.  Given gradual onset of symptoms, low concern for flu although he does state his daughter tested positive for flu a couple days ago.  Likely viral URI.  No concern for pneumonia at this time given stable vitals, lungs clear to auscultation, dry cough only started today.  No negation for chest x-ray imaging as time.  Patient stable and appropriate for discharge home this time.  Impression: Viral URI  Disposition:  The patient was discharged home with instructions to use over-the-counter medications as needed.  Remain out of work until his symptoms start to improve and he is fever free.  Work note provided. Return precautions given.              Final Clinical Impression(s) / ED Diagnoses Final diagnoses:  Viral URI with cough    Rx  / DC Orders ED Discharge Orders     None         Arabella Merles, PA-C 11/10/23 1637    Terald Sleeper, MD 11/12/23 [phone]

## 2023-11-10 NOTE — Discharge Instructions (Signed)
You appear to have an upper respiratory infection (URI). An upper respiratory tract infection, or cold, is a viral infection of the air passages leading to the lungs. It should improve gradually after 5-7 days. You may have a lingering cough that lasts for 2- 4 weeks after the infection.  Your flu, covid, and RSV test were negative today   There are no medications, such as antibiotics, that will cure your infection.   Home care instructions:  You can take Tylenol and/or Ibuprofen as directed on the packaging for fever reduction and pain relief.    For cough: honey 1/2 to 1 teaspoon (you can dilute the honey in water or another fluid).  You can also use guaifenesin and dextromethorphan for cough which are over-the-counter medications. You can use a humidifier for chest congestion and cough.  If you don't have a humidifier, you can sit in the bathroom with the hot shower running.      For sore throat: try warm salt water gargles, cepacol lozenges, throat spray, warm tea or water with lemon/honey, popsicles or ice, or OTC cold relief medicine for throat discomfort.    For congestion: Flonase 1-2 sprays in each nostril daily.    It is important to stay hydrated: drink plenty of fluids (water, gatorade/powerade/pedialyte, juices, or teas) to keep your throat moisturized and help further relieve irritation/discomfort.   Your illness is contagious and can be spread to others, especially during the first 3 or 4 days. It cannot be cured by antibiotics or other medicines. Take basic precautions such as washing your hands often, covering your mouth when you cough or sneeze, and avoiding public places where you could spread your illness to others.   Follow-up instructions: Please follow-up with your primary care provider for further evaluation of your symptoms if you are not feeling better within the next 5 days.   Return instructions:  Please return to the Emergency Department if you experience worsening  symptoms.  RETURN IMMEDIATELY IF you develop shortness of breath, confusion or altered mental status, a new rash, become dizzy, faint, or poorly responsive, or are unable to be cared for at home. Please return if you have persistent vomiting and cannot keep down fluids or develop a fever that is not controlled by tylenol or motrin.   Please return if you have any other emergent concerns.

## 2023-11-10 NOTE — ED Triage Notes (Signed)
Pt sts daughter tested +  for flu yesterday and he would like to be tested; c/o congestion and general malaise

## 2024-02-02 ENCOUNTER — Other Ambulatory Visit: Payer: Self-pay

## 2024-02-02 ENCOUNTER — Encounter (HOSPITAL_BASED_OUTPATIENT_CLINIC_OR_DEPARTMENT_OTHER): Payer: Self-pay

## 2024-02-02 ENCOUNTER — Emergency Department (HOSPITAL_BASED_OUTPATIENT_CLINIC_OR_DEPARTMENT_OTHER)

## 2024-02-02 ENCOUNTER — Emergency Department (HOSPITAL_BASED_OUTPATIENT_CLINIC_OR_DEPARTMENT_OTHER)
Admission: EM | Admit: 2024-02-02 | Discharge: 2024-02-02 | Disposition: A | Discharge status: Home / Self Care | Attending: Emergency Medicine | Admitting: Emergency Medicine

## 2024-02-02 DIAGNOSIS — R051 Acute cough: Secondary | ICD-10-CM | POA: Insufficient documentation

## 2024-02-02 DIAGNOSIS — R059 Cough, unspecified: Secondary | ICD-10-CM | POA: Diagnosis present

## 2024-02-02 LAB — RESP PANEL BY RT-PCR (RSV, FLU A&B, COVID)  RVPGX2
Influenza A by PCR: NEGATIVE
Influenza B by PCR: NEGATIVE
Resp Syncytial Virus by PCR: NEGATIVE
SARS Coronavirus 2 by RT PCR: NEGATIVE

## 2024-02-02 MED ORDER — BENZONATATE 100 MG PO CAPS
100.0000 mg | ORAL_CAPSULE | Freq: Once | ORAL | Status: AC
  Administered 2024-02-02: 100 mg via ORAL
  Filled 2024-02-02: qty 1

## 2024-02-02 MED ORDER — BENZONATATE 100 MG PO CAPS: 100.0000 mg | ORAL_CAPSULE | Freq: Three times a day (TID) | ORAL | 0 refills | Status: AC

## 2024-02-02 MED ORDER — ONDANSETRON HCL 4 MG PO TABS
4.0000 mg | ORAL_TABLET | Freq: Four times a day (QID) | ORAL | 0 refills | Status: AC
Start: 2024-02-02 — End: ?

## 2024-02-02 MED ORDER — ACETAMINOPHEN 500 MG PO TABS
1000.0000 mg | ORAL_TABLET | Freq: Once | ORAL | Status: AC
  Administered 2024-02-02: 1000 mg via ORAL
  Filled 2024-02-02: qty 2

## 2024-02-02 NOTE — ED Provider Notes (Signed)
 [facility] Provider Note   CSN: [id number] Arrival date & time: 02/02/24  9562     History  Chief Complaint  Patient presents with   URI    Edgar Gutierrez is a 53 y.o. male history of seasonal allergies, GERD, IBS, URI presented for clear sputum cough, congestion, rhinorrhea for the past week.  Patient denies sick contacts.  Patient been able to eat and drink without issue but states he does have a decreased appetite.  Patient denies any fevers and has been taking Tylenol  which seems to help.  Patient denies any abdominal pain, vision changes, neck rigidity, chest pain.    Home Medications Prior to Admission medications   Medication Sig Start Date End Date Taking? Authorizing Provider  benzonatate  (TESSALON ) 100 MG capsule Take 1 capsule (100 mg total) by mouth every 8 (eight) hours. 02/02/24  Yes Fredrika Canby, Arlin Benes, PA-C  ondansetron  (ZOFRAN ) 4 MG tablet Take 1 tablet (4 mg total) by mouth every 6 (six) hours. 02/02/24  Yes Denese Finn, PA-C  omeprazole (PRILOSEC) 40 MG capsule Take 40 mg by mouth daily.    [provider]  oseltamivir  (TAMIFLU ) 75 MG capsule Take 1 capsule (75 mg total) by mouth every 12 (twelve) hours. 10/10/22   Vernestine Gondola, PA-C  sildenafil (VIAGRA) 25 MG tablet Take 25 mg by mouth as needed for erectile dysfunction.    [provider]      Allergies    Bee venom    Review of Systems   Review of Systems  Physical Exam Updated Vital Signs BP (!) 119/92   Pulse 94   Temp 98.2 F (36.8 C) (Oral)   Resp 20   Ht 5\' 10"  (1.778 m)   Wt 86.2 kg   SpO2 99%   BMI 27.26 kg/m  Physical Exam Constitutional:      General: He is not in acute distress.    Comments: Resting comfortably on phone  HENT:     Head: Normocephalic and atraumatic.     Jaw: There is normal jaw occlusion.     Salivary Glands: Right salivary gland is not diffusely enlarged or tender. Left salivary gland is not  diffusely enlarged or tender.     Comments: No facial swelling    Right Ear: Hearing, tympanic membrane, ear canal and external ear normal.     Left Ear: Hearing, tympanic membrane, ear canal and external ear normal.     Nose: Congestion and rhinorrhea present.     Mouth/Throat:     Lips: Pink.     Mouth: Mucous membranes are moist.     Pharynx: Oropharynx is clear. Uvula midline. Posterior oropharyngeal erythema present. No oropharyngeal exudate.     Comments: No oral floor swelling Tolerating secretions No purulent drainage no No PTA noted No muffled voice noted Eyes:     Extraocular Movements: Extraocular movements intact.     Conjunctiva/sclera: Conjunctivae normal.     Pupils: Pupils are equal, round, and reactive to light.  Neck:     Comments: No neck swelling Cardiovascular:     Rate and Rhythm: Normal rate and regular rhythm.     Pulses: Normal pulses.     Heart sounds: Normal heart sounds.  Pulmonary:     Effort: Pulmonary effort is normal. No respiratory distress.     Breath sounds: Normal breath sounds.  Abdominal:     General: Abdomen is flat.     Palpations: Abdomen is soft.  Tenderness: There is no abdominal tenderness. There is no guarding.  Musculoskeletal:        General: Normal range of motion.     Cervical back: Normal range of motion and neck supple. No rigidity or tenderness.  Skin:    General: Skin is warm.     Capillary Refill: Capillary refill takes less than 2 seconds.  Neurological:     General: No focal deficit present.     Mental Status: He is alert and oriented to person, place, and time.  Psychiatric:        Mood and Affect: Mood normal.     ED Results / Procedures / Treatments   Labs (all labs ordered are listed, but only abnormal results are displayed) Labs Reviewed  RESP PANEL BY RT-PCR (RSV, FLU A&B, COVID)  [id number]    EKG None  Radiology No results found.  Procedures Procedures    Medications Ordered in ED Medications   acetaminophen  (TYLENOL ) tablet 1,000 mg (1,000 mg Oral Given 02/02/24 1007)  benzonatate  (TESSALON ) capsule 100 mg (100 mg Oral Given 02/02/24 1006)    ED Course/ Medical Decision Making/ A&P                                 Medical Decision Making Amount and/or Complexity of Data Reviewed Radiology: ordered.  Risk OTC drugs. Prescription drug management.   [name] 53 y.o. presented today for URI like symptoms. Working DDx that I considered at this time includes, but not limited to, viral illness, pharyngitis, mono, sinusitis, electrolyte abnormality, AOM, pneumonia.  R/o DDx: pharyngitis, mono, sinusitis, electrolyte abnormality, AOM, pneumonia: these diagnoses are not consistent with patient's history, presentation, physical exam, labs/imaging findings.  Review of prior external notes: 09/18/2023 office visit  Unique Tests and My Independent Interpretation:  Respiratory Panel: Pending Chest x-ray: No acute pathology  Social Determinants of Health: none  Discussion with Independent Historian: None  Discussion of Management of Tests: None  Risk: Medium: prescription drug management  Risk Stratification Score: Centor score 0  Plan: On exam patient was no acute distress with stable vitals.  On exam patient has some congestion and rhinorrhea with some erythema on his tonsils however there is no lymphadenopathy or other signs or symptoms of necessitate a strep test as his Centor score is 0.  Patient's exam was ultimately reassuring and at this time do believe he has a viral illness.  Will get respiratory panel.  After discussion with the patient we agreed to get a chest x-ray and if I do not see any acute abnormalities we will discharge we will follow-up with him if radiology notes any new or different findings.  Will give Tessalon  Tylenol  here along with prescribed Tessalon .  Will discharge and have patient follow-up on respiratory panel as it has been a week and so there would  be no indication for Tamiflu  or Paxlovid also patient is low risk will not require these medications anyway.  Patient was p.o. challenged and is safe to be discharged at this time follow-up outpatient.  Patient is not requiring any oxygen and has not required any oxygen at any point during the ED stay.  I recommended patient use Tylenol  every 6 hours needed for pain remain hydrated.  Will prescribe Zofran  in case patient becomes nauseous.  Recommend patient follows up with primary care provider.  At this time patient is safe to be discharged.  Radiology does not see  any abnormalities.  Patient was given return precautions. Patient stable for discharge at this time.  Patient verbalized understanding of plan.  This chart was dictated using voice recognition software.  Despite best efforts to proofread,  errors can occur which can change the documentation meaning.        Final Clinical Impression(s) / ED Diagnoses Final diagnoses:  Acute cough    Rx / DC Orders ED Discharge Orders          Ordered    benzonatate  (TESSALON ) 100 MG capsule  Every 8 hours        02/02/24 1001    ondansetron  (ZOFRAN ) 4 MG tablet  Every 6 hours        02/02/24 1001              Denese Finn, PA-C 02/02/24 1037    Albertus Hughs, DO 02/02/24 1052

## 2024-02-02 NOTE — ED Triage Notes (Signed)
 Congestion, productive cough, sore throat, chills for approx 1 week.   Denies chest pain or SHOB

## 2024-02-02 NOTE — Discharge Instructions (Addendum)
 Please follow-up with your primary care provider regards recent ER visit. Today your exam is consistent with a viral illness that should resolve in the next few days however you will need to follow-up on the MyChart app in regards to the chest x-ray and viral panel. Please use Tylenol  or ibuprofen  every 6 hours as needed for pain. I have prescribed Zofran  in case you become nauseous and the Tessalon  for your cough. Please remain hydrated and eat food as tolerated. If symptoms change or worsen please return to the ER.

## 2024-02-02 NOTE — ED Notes (Signed)
 Pt alert and oriented X 4 at the time of discharge. RR even and unlabored. No acute distress noted. Pt verbalized understanding of discharge instructions as discussed. Pt ambulatory to lobby at time of discharge.

## 2024-02-02 NOTE — ED Notes (Signed)
 Fluid challenge successful. Pt was able to tolerate fluids. Denies nausea, vomiting.
